# Patient Record
Sex: Male | Born: 1945 | Race: White | Hispanic: No | Marital: Married | Smoking: Never smoker
Health system: Southern US, Community
[De-identification: ages and names within clinical notes are randomized; demographics above are authoritative.]

## PROBLEM LIST (undated history)

## (undated) DIAGNOSIS — E785 Hyperlipidemia, unspecified: Secondary | ICD-10-CM

## (undated) DIAGNOSIS — I10 Essential (primary) hypertension: Secondary | ICD-10-CM

## (undated) DIAGNOSIS — E119 Type 2 diabetes mellitus without complications: Secondary | ICD-10-CM

## (undated) HISTORY — PX: HERNIA REPAIR: SHX51

## (undated) HISTORY — DX: Essential (primary) hypertension: I10

## (undated) HISTORY — PX: CATARACT EXTRACTION: SUR2

## (undated) HISTORY — DX: Hyperlipidemia, unspecified: E78.5

## (undated) HISTORY — DX: Type 2 diabetes mellitus without complications: E11.9

---

## 1999-06-18 ENCOUNTER — Emergency Department (HOSPITAL_COMMUNITY): Admission: EM | Admit: 1999-06-18 | Discharge: 1999-06-18 | Payer: Self-pay | Admitting: Emergency Medicine

## 2014-02-25 DIAGNOSIS — E785 Hyperlipidemia, unspecified: Secondary | ICD-10-CM | POA: Diagnosis not present

## 2014-02-25 DIAGNOSIS — E119 Type 2 diabetes mellitus without complications: Secondary | ICD-10-CM | POA: Diagnosis not present

## 2014-02-25 DIAGNOSIS — I10 Essential (primary) hypertension: Secondary | ICD-10-CM | POA: Diagnosis not present

## 2014-08-23 DIAGNOSIS — Z23 Encounter for immunization: Secondary | ICD-10-CM | POA: Diagnosis not present

## 2014-08-23 DIAGNOSIS — E1165 Type 2 diabetes mellitus with hyperglycemia: Secondary | ICD-10-CM | POA: Diagnosis not present

## 2014-08-23 DIAGNOSIS — E119 Type 2 diabetes mellitus without complications: Secondary | ICD-10-CM | POA: Diagnosis not present

## 2014-08-23 DIAGNOSIS — E785 Hyperlipidemia, unspecified: Secondary | ICD-10-CM | POA: Diagnosis not present

## 2014-08-23 DIAGNOSIS — I10 Essential (primary) hypertension: Secondary | ICD-10-CM | POA: Diagnosis not present

## 2014-11-22 DIAGNOSIS — E119 Type 2 diabetes mellitus without complications: Secondary | ICD-10-CM | POA: Diagnosis not present

## 2014-11-22 DIAGNOSIS — Z794 Long term (current) use of insulin: Secondary | ICD-10-CM | POA: Diagnosis not present

## 2014-11-22 DIAGNOSIS — E785 Hyperlipidemia, unspecified: Secondary | ICD-10-CM | POA: Diagnosis not present

## 2014-11-22 DIAGNOSIS — I10 Essential (primary) hypertension: Secondary | ICD-10-CM | POA: Diagnosis not present

## 2014-11-22 DIAGNOSIS — Z23 Encounter for immunization: Secondary | ICD-10-CM | POA: Diagnosis not present

## 2014-12-27 DIAGNOSIS — Z794 Long term (current) use of insulin: Secondary | ICD-10-CM | POA: Diagnosis not present

## 2014-12-27 DIAGNOSIS — E1165 Type 2 diabetes mellitus with hyperglycemia: Secondary | ICD-10-CM | POA: Diagnosis not present

## 2014-12-27 DIAGNOSIS — H6123 Impacted cerumen, bilateral: Secondary | ICD-10-CM | POA: Diagnosis not present

## 2015-01-14 ENCOUNTER — Ambulatory Visit (INDEPENDENT_AMBULATORY_CARE_PROVIDER_SITE_OTHER): Payer: Medicare Other | Admitting: Physician Assistant

## 2015-01-14 VITALS — BP 118/82 | HR 101 | Temp 97.5°F | Resp 18 | Ht 73.0 in | Wt 238.2 lb

## 2015-01-14 DIAGNOSIS — J069 Acute upper respiratory infection, unspecified: Secondary | ICD-10-CM

## 2015-01-14 DIAGNOSIS — H6982 Other specified disorders of Eustachian tube, left ear: Secondary | ICD-10-CM | POA: Diagnosis not present

## 2015-01-14 MED ORDER — IPRATROPIUM BROMIDE 0.03 % NA SOLN: 2.0000 | Freq: Two times a day (BID) | NASAL | Status: DC

## 2015-01-14 MED ORDER — AMOXICILLIN 875 MG PO TABS: 875.0000 mg | ORAL_TABLET | Freq: Two times a day (BID) | ORAL | Status: DC

## 2015-01-14 MED ORDER — GUAIFENESIN ER 1200 MG PO TB12: 1.0000 | ORAL_TABLET | Freq: Two times a day (BID) | ORAL | Status: DC | PRN

## 2015-01-14 MED ORDER — BENZONATATE 100 MG PO CAPS: 100.0000 mg | ORAL_CAPSULE | Freq: Three times a day (TID) | ORAL | Status: DC | PRN

## 2015-01-14 NOTE — Progress Notes (Signed)
[facility] [address], [place] [address] [phone] [phone]  Date:  01/14/2015   Name:  Lucas Silva   DOB:  07/02/1945   MRN:  [id number]  PCP:  Devra Dopp, MD    History of Present Illness:  Lucas Silva is a 69 y.o. male patient who presents to [facility] for cc of ear pain for 1 month.   Ear pain intermittent for 1 month.  He was seen by his primary care.  They cleaned the ears which helped but pain is returning.  Cough productive white-greenish sputum.  Rhinorrhea.  No sore throat.  Left ear pain.  Hearing okay.  Pain is in the back of left ear.  No sob or dyspnea.     There are no active problems to display for this patient.   History reviewed. No pertinent past medical history.  Past Surgical History  Procedure Laterality Date  . Hernia repair      Social History  Substance Use Topics  . Smoking status: Never Smoker   . Smokeless tobacco: None  . Alcohol Use: No    Family History  Problem Relation Age of Onset  . Diabetes Mother   . Stroke Father     No Known Allergies  Medication list has been reviewed and updated.  No current outpatient prescriptions on file prior to visit.   No current facility-administered medications on file prior to visit.    ROS ROS otherwise unremarkable unless listed above.   Physical Examination: BP 118/82 mmHg  Pulse 101  Temp(Src) 97.5 F (36.4 C) (Oral)  Resp 18  Ht  (1.854 m)  Wt 238 lb 3.2 oz (108.047 kg)  BMI 31.43 kg/m2  SpO2 98% Ideal Body Weight: Weight in (lb) to have BMI = 25: 189.1  Physical Exam  Constitutional: He is oriented to person, place, and time. He appears well-developed and well-nourished. No distress.  HENT:  Head: Atraumatic.  Right Ear: External ear and ear canal normal. No mastoid tenderness. Tympanic membrane is not injected and not perforated.  Left Ear: External ear and ear canal normal. No mastoid tenderness. Tympanic membrane is not injected and  not perforated.  Nose: Rhinorrhea present. No mucosal edema. Right sinus exhibits no maxillary sinus tenderness and no frontal sinus tenderness. Left sinus exhibits no maxillary sinus tenderness and no frontal sinus tenderness.  Mouth/Throat: No uvula swelling. No oropharyngeal exudate, posterior oropharyngeal edema or posterior oropharyngeal erythema.  Minimal bulging with effusion.    Eyes: Conjunctivae, EOM and lids are normal. Pupils are equal, round, and reactive to light. Right eye exhibits normal extraocular motion. Left eye exhibits normal extraocular motion.  Neck: Trachea normal and full passive range of motion without pain. No edema and no erythema present.  Cardiovascular: Normal rate.   Pulmonary/Chest: Effort normal. No respiratory distress. He has no decreased breath sounds. He has no wheezes. He has no rhonchi.  Neurological: He is alert and oriented to person, place, and time.  Skin: Skin is warm and dry. He is not diaphoretic.  Psychiatric: He has a normal mood and affect. His behavior is normal.     Assessment and Plan: Lucas Silva is a 69 y.o. male who is here today for cc of ear pain and cough. Treating with azithromycin.  Will attempt ipratropium nasal spray.  Will hold off on pseudoephedrine or prednisone in this htn and diabetic patient.   Acute upper respiratory infection - Plan: ipratropium (ATROVENT) 0.03 % nasal  spray, amoxicillin (AMOXIL) 875 MG tablet, benzonatate (TESSALON) 100 MG capsule  Eustachian tube dysfunction, left - Plan: Guaifenesin (MUCINEX MAXIMUM STRENGTH) 1200 MG TB12, ipratropium (ATROVENT) 0.03 % nasal spray, amoxicillin (AMOXIL) 875 MG tablet, benzonatate (TESSALON) 100 MG capsule   Trena PlattStephanie Anatalia Kronk, PA-C [facility] 01/14/2015 12:14 PM

## 2015-01-14 NOTE — Patient Instructions (Signed)
Let's take the medication as prescribed. Make sure that you hydrating well with 3 regular sized water bottles.

## 2015-01-15 ENCOUNTER — Encounter: Payer: Self-pay | Admitting: Physician Assistant

## 2015-10-07 ENCOUNTER — Emergency Department (HOSPITAL_COMMUNITY): Payer: Medicare Other

## 2015-10-07 ENCOUNTER — Emergency Department (HOSPITAL_COMMUNITY)
Admission: EM | Admit: 2015-10-07 | Discharge: 2015-10-07 | Disposition: A | Discharge status: Home / Self Care | Payer: Medicare Other | Attending: Emergency Medicine | Admitting: Emergency Medicine

## 2015-10-07 ENCOUNTER — Encounter (HOSPITAL_COMMUNITY): Payer: Self-pay | Admitting: Emergency Medicine

## 2015-10-07 DIAGNOSIS — Z7982 Long term (current) use of aspirin: Secondary | ICD-10-CM | POA: Diagnosis not present

## 2015-10-07 DIAGNOSIS — Z794 Long term (current) use of insulin: Secondary | ICD-10-CM | POA: Diagnosis not present

## 2015-10-07 DIAGNOSIS — M545 Low back pain: Secondary | ICD-10-CM | POA: Insufficient documentation

## 2015-10-07 MED ORDER — METHOCARBAMOL 500 MG PO TABS: 500.0000 mg | ORAL_TABLET | Freq: Two times a day (BID) | ORAL | 0 refills | Status: AC | PRN

## 2015-10-07 MED ORDER — OXYCODONE-ACETAMINOPHEN 5-325 MG PO TABS
1.0000 | ORAL_TABLET | Freq: Once | ORAL | Status: AC
  Administered 2015-10-07: 1 via ORAL
  Filled 2015-10-07: qty 1

## 2015-10-07 MED ORDER — ACETAMINOPHEN 325 MG PO TABS: 650.0000 mg | ORAL_TABLET | Freq: Four times a day (QID) | ORAL | 0 refills | Status: AC | PRN

## 2015-10-07 NOTE — ED Provider Notes (Signed)
[facility]-EMERGENCY DEPT Provider Note   CSN: [id number] Arrival date & time: 10/07/15  1651     History   Chief Complaint Chief Complaint  Patient presents with  . Back Pain    HPI Lucas Silva is a 70 y.o. male.  Lucas Silva is a 70 y.o. Male who presents to the ED complaining of worsening low back pain after bending over earlier today. Patient reports he always has some low back pain but today around 11 AM after he walked to get the mail he had worsening pain after bending over. He reports his pain is worse with movement. He reports his pain is in the midline and does not radiate. He has a history kidney stones reports this feels different. He has taken nothing for treatment of his symptoms today. He denies falls or trauma to his back. He denies history of IV drug use, cancer or falls. He denies any injury or trauma to his back. He denies fevers, loss of bladder control, loss of bowel control, difficulty urinating, urinary symptoms, hematuria, abdominal pain, nausea, vomiting, numbness, or weakness.   The history is provided by the patient and the spouse. No language interpreter was used.  Back Pain   Pertinent negatives include no chest pain, no fever, no numbness, no headaches, no abdominal pain, no dysuria and no weakness.    History reviewed. No pertinent past medical history.  There are no active problems to display for this patient.   Past Surgical History:  Procedure Laterality Date  . HERNIA REPAIR         Home Medications    Prior to Admission medications   Medication Sig Start Date End Date Taking? Authorizing Provider  aspirin 81 MG tablet Take 81 mg by mouth daily.   Yes Historical Provider, MD  cholecalciferol (VITAMIN D) 1000 units tablet Take 1,000 Units by mouth daily.   Yes Historical Provider, MD  hydrochlorothiazide (HYDRODIURIL) 25 MG tablet Take 25 mg by mouth daily.   Yes Historical Provider, MD  insulin aspart protamine- aspart (NOVOLOG  MIX 70/30) (70-30) 100 UNIT/ML injection Inject 40 Units into the skin daily.    Yes Historical Provider, MD  lisinopril (PRINIVIL,ZESTRIL) 40 MG tablet Take 40 mg by mouth daily.   Yes Historical Provider, MD  metFORMIN (GLUMETZA) 1000 MG (MOD) 24 hr tablet Take 1,000 mg by mouth 2 (two) times daily.    Yes Historical Provider, MD  simvastatin (ZOCOR) 10 MG tablet Take 10 mg by mouth daily.   Yes Historical Provider, MD  acetaminophen (TYLENOL) 325 MG tablet Take 2 tablets (650 mg total) by mouth every 6 (six) hours as needed for mild pain or moderate pain. 10/07/15   Everlene FarrierWilliam Saudia Smyser, PA-C  methocarbamol (ROBAXIN) 500 MG tablet Take 1 tablet (500 mg total) by mouth 2 (two) times daily as needed for muscle spasms. 10/07/15   Everlene FarrierWilliam Brinley Treanor, PA-C    Family History Family History  Problem Relation Age of Onset  . Diabetes Mother   . Stroke Father     Social History Social History  Substance Use Topics  . Smoking status: Never Smoker  . Smokeless tobacco: Not on file  . Alcohol use No     Allergies   Review of patient's allergies indicates no known allergies.   Review of Systems Review of Systems  Constitutional: Negative for chills and fever.  HENT: Negative for congestion and sore throat.   Eyes: Negative for visual disturbance.  Respiratory: Negative for cough and shortness  of breath.   Cardiovascular: Negative for chest pain.  Gastrointestinal: Negative for abdominal pain, nausea and vomiting.  Genitourinary: Negative for decreased urine volume, difficulty urinating, dysuria, frequency, hematuria and urgency.  Musculoskeletal: Positive for back pain. Negative for gait problem, joint swelling and neck pain.  Skin: Negative for rash.  Neurological: Negative for weakness, numbness and headaches.     Physical Exam Updated Vital Signs BP 126/72 (BP Location: Left Arm)   Pulse 86   Temp 98.4 F (36.9 C) (Oral)   Resp 14   SpO2 98%   Physical Exam  Constitutional: He is  oriented to person, place, and time. He appears well-developed and well-nourished. No distress.  Nontoxic appearing.  HENT:  Head: Normocephalic and atraumatic.  Eyes: Conjunctivae are normal. Pupils are equal, round, and reactive to light. Right eye exhibits no discharge. Left eye exhibits no discharge.  Neck: Neck supple.  Cardiovascular: Normal rate, regular rhythm, normal heart sounds and intact distal pulses.   Pulmonary/Chest: Effort normal and breath sounds normal. No respiratory distress. He has no wheezes. He has no rales.  Abdominal: Soft. There is no tenderness.  Abdomen is soft and nontender to palpation. No CVA or flank tenderness.  Musculoskeletal: Normal range of motion. He exhibits tenderness. He exhibits no edema or deformity.  Patient has tenderness to his midline lumbar spine. No crepitus or deformity. No CVA or flank tenderness. No back erythema, deformity, ecchymosis or warmth. Good strength his bilateral lower extremities.  Lymphadenopathy:    He has no cervical adenopathy.  Neurological: He is alert and oriented to person, place, and time. He has normal reflexes. He displays normal reflexes. Coordination normal.  The patient is alert and oriented 3. Sensation is intact his bilateral lower extremities. Normal gait. Bilateral patellar DTRs are intact.  Skin: Skin is warm and dry. Capillary refill takes less than 2 seconds. No rash noted. He is not diaphoretic. No erythema. No pallor.  Psychiatric: He has a normal mood and affect. His behavior is normal.  Nursing note and vitals reviewed.    ED Treatments / Results  Labs (all labs ordered are listed, but only abnormal results are displayed) Labs Reviewed - No data to display  EKG  EKG Interpretation None       Radiology Dg Lumbar Spine Complete  Result Date: 10/07/2015 CLINICAL DATA:  Acute onset of left lower back pain. Initial encounter. EXAM: LUMBAR SPINE - COMPLETE 4+ VIEW COMPARISON:  None. FINDINGS:  There is no evidence of fracture or subluxation. Vertebral bodies demonstrate normal height and alignment. Intervertebral disc spaces are preserved. Mild facet disease is noted at the lower lumbar spine. The visualized bowel gas pattern is unremarkable in appearance; air and stool are noted within the colon. The sacroiliac joints are within normal limits. IMPRESSION: No evidence of fracture or subluxation along the lumbar spine. Electronically Signed   By: Roanna Raider M.D.   On: 10/07/2015 19:36    Procedures Procedures (including critical care time)  Medications Ordered in ED Medications  oxyCODONE-acetaminophen (PERCOCET/ROXICET) 5-325 MG per tablet 1 tablet (1 tablet Oral Given 10/07/15 1855)     Initial Impression / Assessment and Plan / ED Course  I have reviewed the triage vital signs and the nursing notes.  Pertinent labs & imaging results that were available during my care of the patient were reviewed by me and considered in my medical decision making (see chart for details).  Clinical Course   This is a 70 y.o. Male who presents  to the ED complaining of worsening low back pain after bending over earlier today. Patient reports he always has some low back pain but today around 11 AM after he walked to get the mail he had worsening pain after bending over. He reports his pain is worse with movement. He reports his pain is in the midline and does not radiate. He has a history kidney stones reports this feels different. He has taken nothing for treatment of his symptoms today. He denies falls or trauma to his back. Patient with back pain.  No neurological deficits and normal neuro exam.  Patient can walk with normal gait.  No loss of bowel or bladder control.  No concern for cauda equina.  No fever, night sweats, weight loss, h/o cancer, IVDU. X-ray of his lumbar spine was obtained which was unremarkable. He reports feeling better after pain medication in the ER. Patient cannot take NSAIDs.  He elected for tylenol and robaxin for use at home. I discussed precautions when using robaxin. I advised the patient to follow-up with their primary care provider this week. I advised the patient to return to the emergency department with new or worsening symptoms or new concerns. The patient verbalized understanding and agreement with plan.    Final Clinical Impressions(s) / ED Diagnoses   Final diagnoses:  Midline low back pain, with sciatica presence unspecified    New Prescriptions Discharge Medication List as of 10/07/2015  8:54 PM    START taking these medications   Details  acetaminophen (TYLENOL) 325 MG tablet Take 2 tablets (650 mg total) by mouth every 6 (six) hours as needed for mild pain or moderate pain., Starting Sat 10/07/2015, Print    methocarbamol (ROBAXIN) 500 MG tablet Take 1 tablet (500 mg total) by mouth 2 (two) times daily as needed for muscle spasms., Starting Sat 10/07/2015, Print         Everlene Farrier, PA-C 10/08/15 [id number]    Benjiman Core, MD 10/08/15 [phone]

## 2015-10-07 NOTE — ED Triage Notes (Signed)
Pt c/o middle back pain since earlier today. Pt sts he was having pain on his L side but then bent down to get mail and has had severe pain ever since. Pt denies numbness and tingling. Pt has hx of kidney stones but denies urinary symptoms at this time. Pt A&Ox4 and ambulatory.

## 2015-10-07 NOTE — ED Notes (Signed)
PA at bedside.

## 2015-10-07 NOTE — ED Notes (Signed)
Patient transported to X-ray 

## 2017-05-24 ENCOUNTER — Encounter: Payer: Self-pay | Admitting: Urgent Care

## 2017-05-24 ENCOUNTER — Ambulatory Visit (INDEPENDENT_AMBULATORY_CARE_PROVIDER_SITE_OTHER): Payer: Medicare Other | Admitting: Urgent Care

## 2017-05-24 VITALS — BP 152/76 | HR 97 | Temp 98.7°F | Ht 71.0 in | Wt 262.0 lb

## 2017-05-24 DIAGNOSIS — K1379 Other lesions of oral mucosa: Secondary | ICD-10-CM

## 2017-05-24 DIAGNOSIS — K029 Dental caries, unspecified: Secondary | ICD-10-CM

## 2017-05-24 DIAGNOSIS — R030 Elevated blood-pressure reading, without diagnosis of hypertension: Secondary | ICD-10-CM

## 2017-05-24 DIAGNOSIS — K047 Periapical abscess without sinus: Secondary | ICD-10-CM | POA: Diagnosis not present

## 2017-05-24 DIAGNOSIS — I10 Essential (primary) hypertension: Secondary | ICD-10-CM

## 2017-05-24 MED ORDER — AMOXICILLIN-POT CLAVULANATE 875-125 MG PO TABS: 1.0000 | ORAL_TABLET | Freq: Two times a day (BID) | ORAL | 0 refills | Status: DC

## 2017-05-24 NOTE — Addendum Note (Signed)
Addended by: Wallis Bamberg on: 05/24/2017 10:14 AM   Modules accepted: Level of Service

## 2017-05-24 NOTE — Patient Instructions (Addendum)
Eat soups, soft foods. Hydrate well with at least 2 liters (1 gallon) of water daily. You may take  Tylenol with ibuprofen 400-600mg  every 6 hours for pain and inflammation. Please make sure you set up an evaluation with a dentist as soon as possible.    Dental Pain Dental pain may be caused by many things, including:  Tooth decay (cavities or caries). Cavities expose the nerve of your tooth to air and hot or cold temperatures. This can cause pain or discomfort.  Abscess or infection. A dental abscess is a collection of infected pus from a bacterial infection in the inner part of the tooth (pulp). It usually occurs at the end of the tooth's root.  Injury.  An unknown reason (idiopathic).  Your pain may be mild or severe. It may only occur when:  You are chewing.  You are exposed to hot or cold temperature.  You are eating or drinking sugary foods or beverages, such as soda or candy.  Your pain may also be constant. Follow these instructions at home: Watch your dental pain for any changes. The following actions may help to lessen any discomfort that you are feeling:  Take medicines only as directed by your dentist.  If you were prescribed an antibiotic medicine, finish all of it even if you start to feel better.  Keep all follow-up visits as directed by your dentist. This is important.  Do not apply heat to the outside of your face.  Rinse your mouth or gargle with salt water if directed by your dentist. This helps with pain and swelling. ? You can make salt water by adding  tsp of salt to 1 cup of warm water.  Apply ice to the painful area of your face: ? Put ice in a plastic bag. ? Place a towel between your skin and the bag. ? Leave the ice on for 20 minutes, 2-3 times per day.  Avoid foods or drinks that cause you pain, such as: ? Very hot or very cold foods or drinks. ? Sweet or sugary foods or drinks.  Contact a health care provider if:  Your pain is not  controlled with medicines.  Your symptoms are worse.  You have new symptoms. Get help right away if:  You are unable to open your mouth.  You are having trouble breathing or swallowing.  You have a fever.  Your face, neck, or jaw is swollen. This information is not intended to replace advice given to you by your health care provider. Make sure you discuss any questions you have with your health care provider. Document Released: 12/31/2004 Document Revised: 05/11/2015 Document Reviewed: 12/27/2013 Elsevier Interactive Patient Education  2018 [employer].     IF you received an x-ray today, you will receive an invoice from [facility]. Please contact [facility] at [phone] with questions or concerns regarding your invoice.   IF you received labwork today, you will receive an invoice from [facility]. Please contact [facility] at [phone] with questions or concerns regarding your invoice.   Our billing staff will not be able to assist you with questions regarding bills from these companies.  You will be contacted with the lab results as soon as they are available. The fastest way to get your results is to activate your My Chart account. Instructions are located on the last page of this paperwork. If you have not heard from [place] regarding the results in 2 weeks, please contact this office.

## 2017-05-24 NOTE — Progress Notes (Signed)
    MRN: [id number] DOB: 03-Jan-1946  Subjective:   Lucas Silva is a 72 y.o. male presenting for 3-day history of upper tooth pain, warmth of his skin and subjective fever.  Patient denies sinus congestion, sinus pain, throat pain.  He states that his teeth are really bad but he does not get dental care.  He is able to swallow and eat foods, drink liquids.  He does have pain with from this however.  Regarding his blood pressure, patient reports that he is compliant with his medications.  Reports that it usually within normal range in the 120s.  Denies chest pain, heart racing, palpitations, nausea, vomiting, belly pain.  Does have a visit scheduled soon with his PCP.  His kidney function from December 2018 was completely normal.  Jaishaun has a current medication list which includes the following prescription(s): acetaminophen, aspirin, cholecalciferol, hydrochlorothiazide, insulin aspart protamine- aspart, lisinopril, metformin, methocarbamol, and simvastatin. Also has No Known Allergies.  Revis  has a past medical history of Diabetes mellitus without complication (HCC), Hyperlipidemia, and Hypertension. Also  has a past surgical history that includes Hernia repair.  Objective:   Vitals: BP (!) 152/76 (BP Location: Left Arm, Patient Position: Sitting, Cuff Size: Normal)   Pulse 97   Temp 98.7 F (37.1 C) (Oral)   Ht  (1.803 m)   Wt 262 lb (118.8 kg)   SpO2 92%   BMI 36.54 kg/m   BP Readings from Last 3 Encounters:  05/24/17 (!) 152/76  10/07/15 126/72  01/14/15 118/82    Physical Exam  Constitutional: He is oriented to person, place, and time. He appears well-developed and well-nourished.  HENT:  Head:    Mouth/Throat:    Cardiovascular: Normal rate, regular rhythm and intact distal pulses. Exam reveals no gallop and no friction rub.  No murmur heard. Pulmonary/Chest: Effort normal. No respiratory distress. He has no wheezes. He has no rales.  Neurological: He is  alert and oriented to person, place, and time.   Assessment and Plan :   Dental infection  Tooth decay  Dental caries  Oral pain  Essential hypertension  Elevated blood pressure reading  We will have patient start Augmentin to address dental infection.  I emphasized need for consult with a dentist.  He plans on getting in touch with one on Monday.  In the meantime I recommended he eat soft foods, soups and drink liquids.  He is to use Tylenol and ibuprofen for pain and inflammation.  He will maintain his blood pressure medications and follow-up with his PCP if his blood pressure remains elevated.  Wallis Bamberg, PA-C [facility] [phone] 05/24/2017  9:46 AM

## 2018-04-26 IMAGING — CR DG LUMBAR SPINE COMPLETE 4+V
5 series · 5 of 5 positions shown · non-contrast
Comparison: None.

CLINICAL DATA: Acute onset of left lower back pain. Initial
encounter.

EXAM:
LUMBAR SPINE - COMPLETE 4+ VIEW

[t lumbar spine ap]
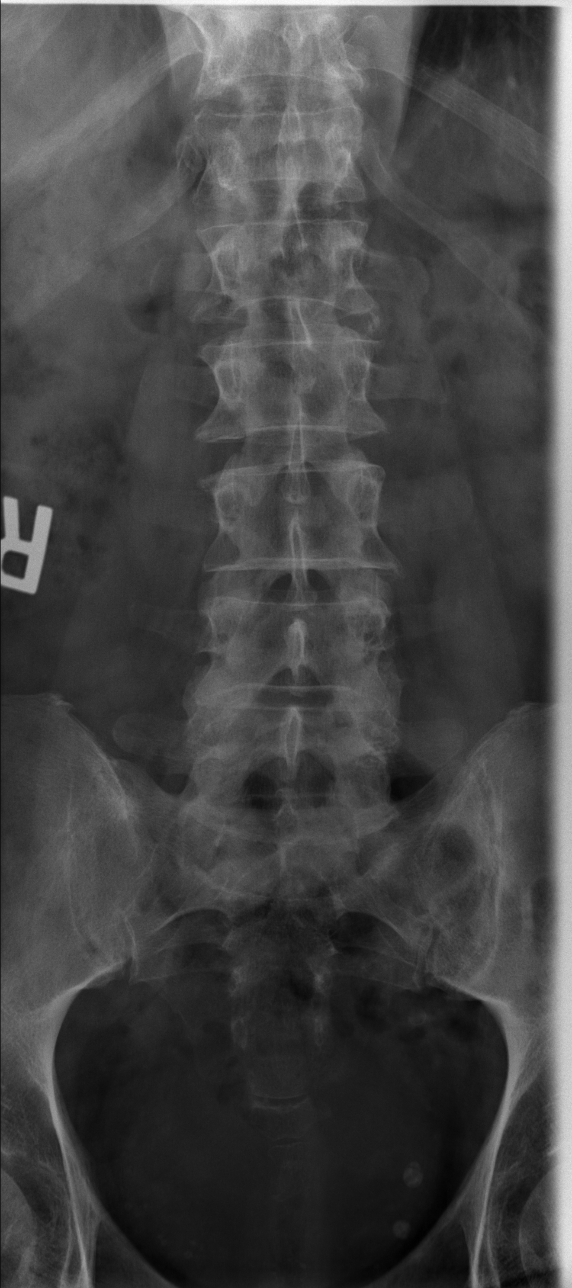

[t lumbar spine obl (1 of 2)]
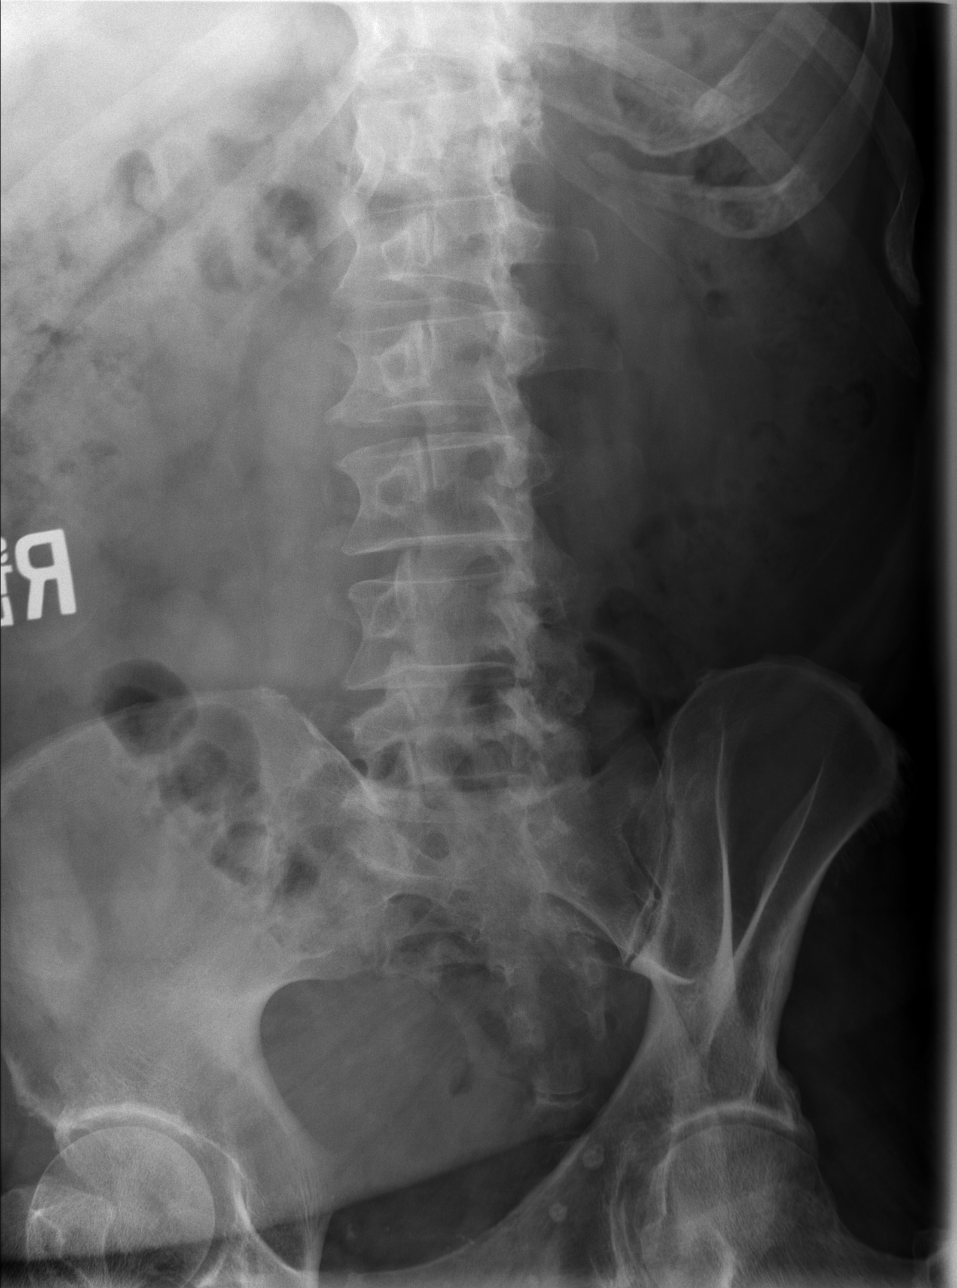

[t lumbar spine obl (2 of 2)]
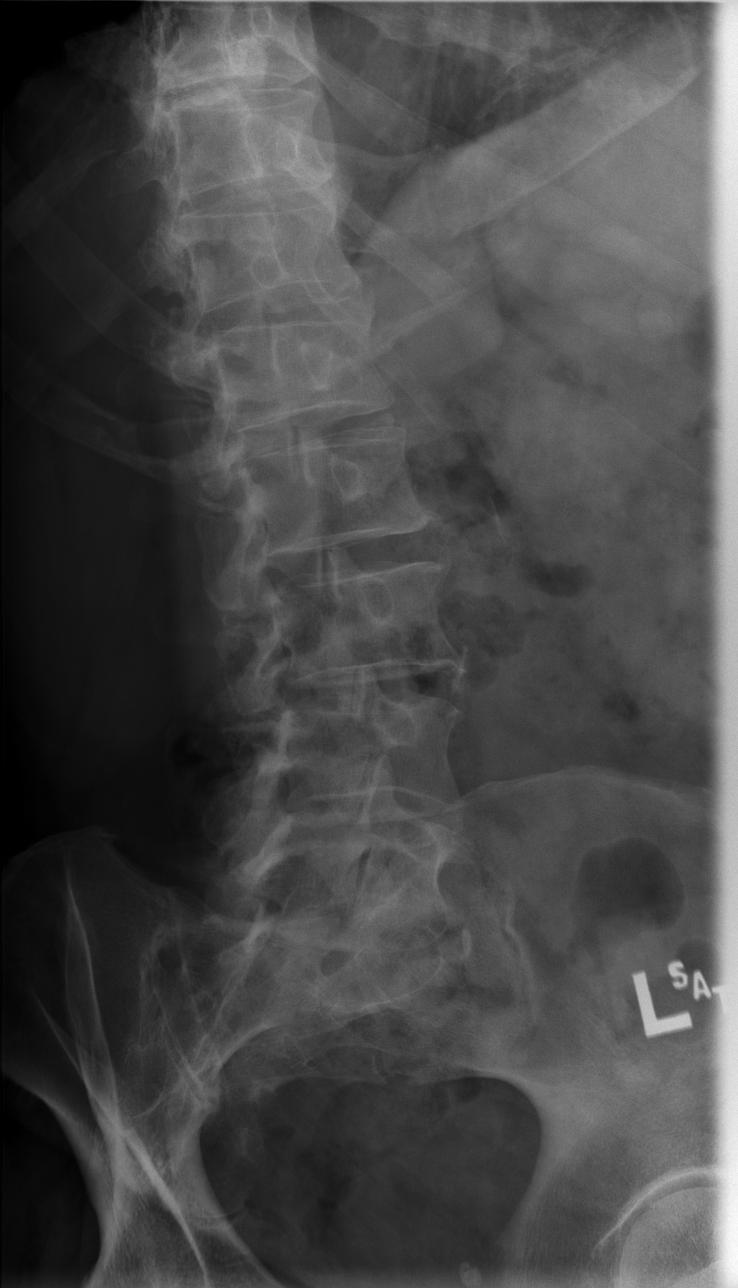

[t lumbar spine lat]
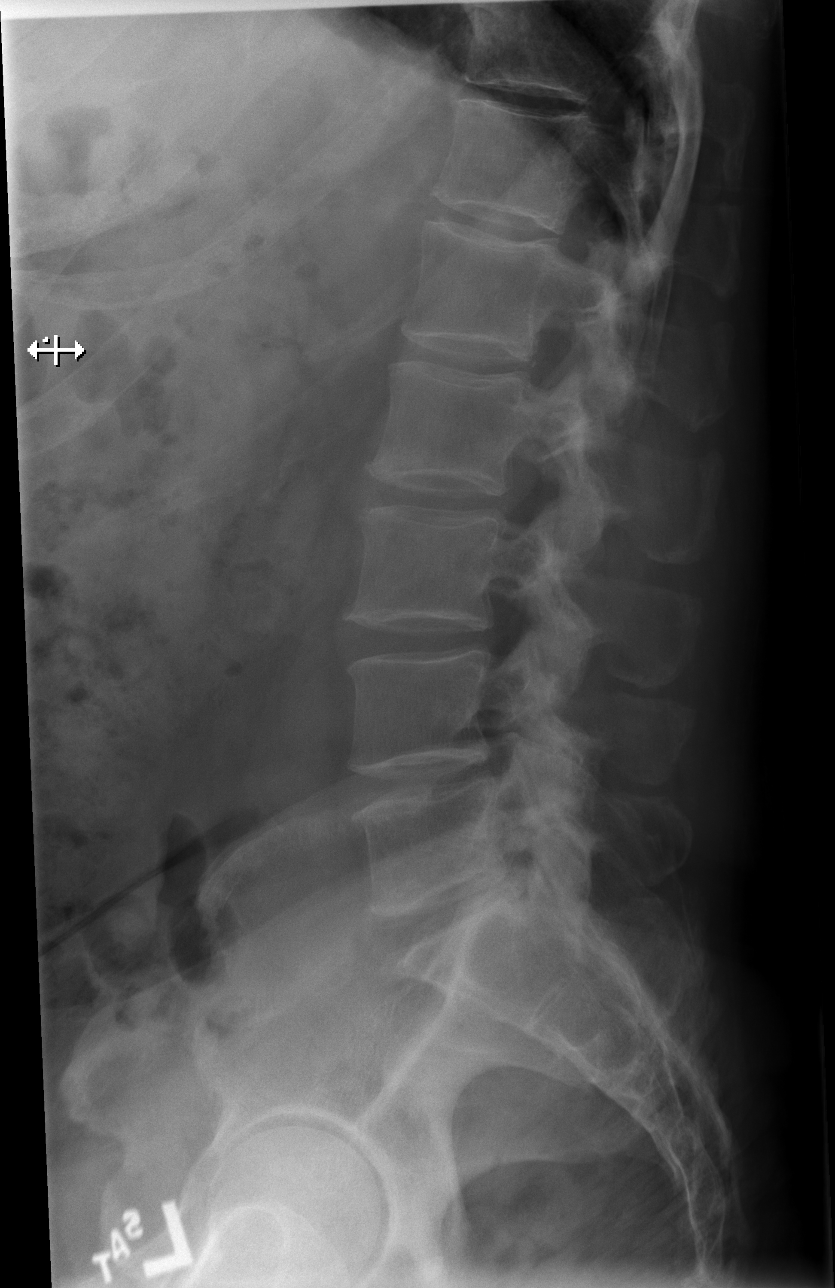

[t lumbar l-5 s-1 spot]
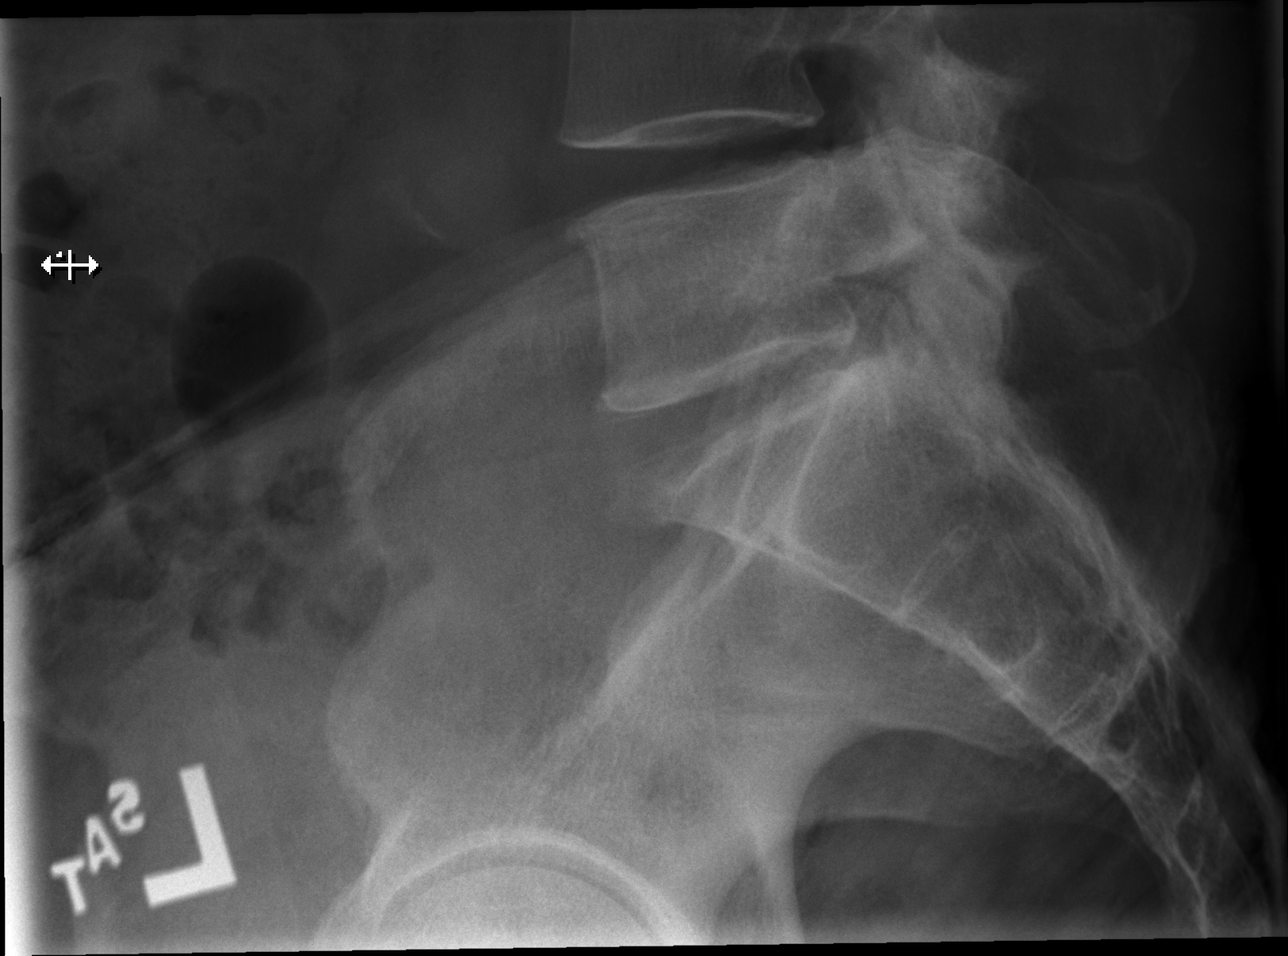

[5 of 5 positions shown; findings below may reference images not displayed]

FINDINGS: There is no evidence of fracture or subluxation. Vertebral bodies
demonstrate normal height and alignment. Intervertebral disc spaces
are preserved. Mild facet disease is noted at the lower lumbar
spine.

The visualized bowel gas pattern is unremarkable in appearance; air
and stool are noted within the colon. The sacroiliac joints are
within normal limits.
IMPRESSION: No evidence of fracture or subluxation along the lumbar spine.

## 2020-03-10 ENCOUNTER — Ambulatory Visit (INDEPENDENT_AMBULATORY_CARE_PROVIDER_SITE_OTHER): Payer: Medicare Other | Admitting: Nurse Practitioner

## 2020-03-10 ENCOUNTER — Other Ambulatory Visit: Payer: Self-pay

## 2020-03-10 VITALS — BP 114/77 | HR 77 | Temp 98.5°F | Ht 71.0 in | Wt 228.3 lb

## 2020-03-10 DIAGNOSIS — E1122 Type 2 diabetes mellitus with diabetic chronic kidney disease: Secondary | ICD-10-CM

## 2020-03-10 DIAGNOSIS — E119 Type 2 diabetes mellitus without complications: Secondary | ICD-10-CM

## 2020-03-10 DIAGNOSIS — Z794 Long term (current) use of insulin: Secondary | ICD-10-CM

## 2020-03-10 DIAGNOSIS — B029 Zoster without complications: Secondary | ICD-10-CM

## 2020-03-10 DIAGNOSIS — Z7689 Persons encountering health services in other specified circumstances: Secondary | ICD-10-CM | POA: Diagnosis not present

## 2020-03-10 DIAGNOSIS — I129 Hypertensive chronic kidney disease with stage 1 through stage 4 chronic kidney disease, or unspecified chronic kidney disease: Secondary | ICD-10-CM

## 2020-03-10 DIAGNOSIS — E785 Hyperlipidemia, unspecified: Secondary | ICD-10-CM

## 2020-03-10 DIAGNOSIS — E1169 Type 2 diabetes mellitus with other specified complication: Secondary | ICD-10-CM | POA: Diagnosis not present

## 2020-03-10 MED ORDER — TRIAMCINOLONE ACETONIDE 0.1 % EX CREA: 1.0000 "application " | TOPICAL_CREAM | Freq: Two times a day (BID) | CUTANEOUS | 0 refills | Status: DC

## 2020-03-10 MED ORDER — VALACYCLOVIR HCL 1 G PO TABS: 1000.0000 mg | ORAL_TABLET | Freq: Two times a day (BID) | ORAL | 0 refills | Status: DC

## 2020-03-10 NOTE — Patient Instructions (Signed)
Shingles  Shingles is an infection. It gives you a painful skin rash and blisters that have fluid in them. Shingles is caused by the same germ (virus) that causes chickenpox. Shingles only happens in people who:  Have had chickenpox.  Have been given a shot of medicine (vaccine) to protect against chickenpox. Shingles is rare in this group. The first symptoms of shingles may be itching, tingling, or pain in an area on your skin. A rash will show on your skin a few days or weeks later. The rash is likely to be on one side of your body. The rash usually has a shape like a belt or a band. Over time, the rash turns into fluid-filled blisters. The blisters will break open, change into scabs, and dry up. Medicines may:  Help with pain and itching.  Help you get better sooner.  Help to prevent long-term problems. Follow these instructions at home: Medicines  Take over-the-counter and prescription medicines only as told by your doctor.  Put on an anti-itch cream or numbing cream where you have a rash, blisters, or scabs. Do this as told by your doctor. Helping with itching and discomfort  Put cold, wet cloths (cold compresses) on the area of the rash or blisters as told by your doctor.  Cool baths can help you feel better. Try adding baking soda or dry oatmeal to the water to lessen itching. Do not bathe in hot water.   Blister and rash care  Keep your rash covered with a loose bandage (dressing).  Wear loose clothing that does not rub on your rash.  Keep your rash and blisters clean. To do this, wash the area with mild soap and cool water as told by your doctor.  Check your rash every day for signs of infection. Check for: ? More redness, swelling, or pain. ? Fluid or blood. ? Warmth. ? Pus or a bad smell.  Do not scratch your rash. Do not pick at your blisters. To help you to not scratch: ? Keep your fingernails clean and cut short. ? Wear gloves or mittens when you sleep, if  scratching is a problem. General instructions  Rest as told by your doctor.  Keep all follow-up visits as told by your doctor. This is important.  Wash your hands often with soap and water. If soap and water are not available, use hand sanitizer. Doing this lowers your chance of getting a skin infection caused by germs (bacteria).  Your infection can cause chickenpox in people who have never had chickenpox or never got a shot of chickenpox vaccine. If you have blisters that did not change into scabs yet, try not to touch other people or be around other people, especially: ? Babies. ? Pregnant women. ? Children who have areas of red, itchy, or rough skin (eczema). ? Very old people who have transplants. ? People who have a long-term (chronic) sickness, like cancer or AIDS. Contact a doctor if:  Your pain does not get better with medicine.  Your pain does not get better after the rash heals.  You have any signs of infection in the rash area. These signs include: ? More redness, swelling, or pain around the rash. ? Fluid or blood coming from the rash. ? The rash area feeling warm to the touch. ? Pus or a bad smell coming from the rash. Get help right away if:  The rash is on your face or nose.  You have pain in your face or pain   by your eye.  You lose feeling on one side of your face.  You have trouble seeing.  You have ear pain, or you have ringing in your ear.  You have a loss of taste.  Your condition gets worse. Summary  Shingles gives you a painful skin rash and blisters that have fluid in them.  Shingles is an infection. It is caused by the same germ (virus) that causes chickenpox.  Keep your rash covered with a loose bandage (dressing). Wear loose clothing that does not rub on your rash.  If you have blisters that did not change into scabs yet, try not to touch other people or be around people. This information is not intended to replace advice given to you by  your health care provider. Make sure you discuss any questions you have with your health care provider. Document Revised: 04/24/2018 Document Reviewed: 09/04/2016 Elsevier Patient Education  2021 Elsevier Inc.  

## 2020-03-10 NOTE — Progress Notes (Signed)
New Patient Office Visit  Subjective:  Patient ID: Lucas Silva, male    DOB: 1945-10-19  Age: 75 y.o. MRN: [id number]  CC:  Chief Complaint  Patient presents with  . New Patient (Initial Visit)    HPI Lucas Silva presents for establishment of primary care. He has medical history of diabetes, hypertension, and hyperlipidemia. He was seeing Azzie Roup, NP in [place]. Provider recently moved away. The patient last saw his PCP in 09/2019 for medicare wellness visit. The patient is followed in the [place] [facility]. He sees an endocrinologist, general internal medicine provider, an ophthalmologist, and ear doctor. He states that he last had blood work done in [place] in September and is due to go back in March. Will have more blood work done at that time. He states that his last colonoscopy was done approximately 8 years ago and was told he only needed to repeat the procedure in ten years. Medical records, test results, and progress notes will be requested from [place] today to update that patient's chart.  Today, the patient is complaining of "spider bites" on the left part of his back. He states that he first noted them on Wednesday. He state that they are very itchy. He has been using OTC hydrocortisone cream, but this has not helped. The bites are only on the left side of his back. They are nowhere else on his body. He denies fever, chills, headache, fatigue, or appetite changes. He denies nausea, vomiting, or diarrhea.   Past Medical History:  Diagnosis Date  . Diabetes mellitus without complication (HCC)   . Hyperlipidemia   . Hypertension     Past Surgical History:  Procedure Laterality Date  . HERNIA REPAIR      Family History  Problem Relation Age of Onset  . Diabetes Mother   . Stroke Father     Social History   Socioeconomic History  . Marital status: Married    Spouse name: Not on file  . Number of children: Not on file  . Years of education: Not on file   . Highest education level: Not on file  Occupational History  . Not on file  Tobacco Use  . Smoking status: Never Smoker  . Smokeless tobacco: Never Used  Substance and Sexual Activity  . Alcohol use: No    Alcohol/week: 0.0 standard drinks  . Drug use: No  . Sexual activity: Not on file  Other Topics Concern  . Not on file  Social History Narrative  . Not on file   Social Determinants of Health   Financial Resource Strain: Not on file  Food Insecurity: Not on file  Transportation Needs: Not on file  Physical Activity: Not on file  Stress: Not on file  Social Connections: Not on file  Intimate Partner Violence: Not on file    ROS Review of Systems  Skin: Positive for rash.       Several lesions along the left side of the spine which are red and described as itchy.   All other systems reviewed and are negative.   Objective:   Today's Vitals: BP 114/77   Pulse 77   Temp 98.5 F (36.9 C)   Ht 5\' 11"  (1.803 m)   Wt 228 lb 4.8 oz (103.6 kg)   SpO2 98%   BMI 31.84 kg/m   Physical Exam Vitals and nursing note reviewed.  Constitutional:      Appearance: Normal appearance. He is well-developed and well-nourished.  HENT:  Head: Normocephalic.     Nose: Nose normal.  Eyes:     Pupils: Pupils are equal, round, and reactive to light.  Neck:     Vascular: No carotid bruit.  Cardiovascular:     Rate and Rhythm: Normal rate and regular rhythm.     Pulses: Normal pulses.     Heart sounds: Normal heart sounds.  Pulmonary:     Effort: Pulmonary effort is normal.     Breath sounds: Normal breath sounds.  Abdominal:     General: Bowel sounds are normal.     Palpations: Abdomen is soft.     Tenderness: There is no abdominal tenderness.  Musculoskeletal:        General: Normal range of motion.     Cervical back: Normal range of motion and neck supple.  Skin:    General: Skin is warm and dry.       Neurological:     Mental Status: He is alert and oriented to  person, place, and time.  Psychiatric:        Mood and Affect: Mood and affect normal.     Assessment & Plan:  1. Encounter to establish care Patient in office to establish with new primary care provider. Will get medical records, labs, and progress notes from [place] hospital to review and update his chart.   2. Herpes zoster without complication Start valacyclovir 1000mg  twice dialy for 5 days. May apply triamcinolone 0.1% cream twice daily as needed for itching. Patient states that he has had Shingles vaccine. Will monitor.  - valACYclovir (VALTREX) 1000 MG tablet; Take 1 tablet (1,000 mg total) by mouth 2 (two) times daily.  Dispense: 10 tablet; Refill: 0 - triamcinolone (KENALOG) 0.1 %; Apply 1 application topically 2 (two) times daily.  Dispense: 30 g; Refill: 0  3. Hyperlipidemia due to type 2 diabetes mellitus (HCC) Continue simvastatin as prescribed.  - simvastatin (ZOCOR) 40 MG tablet; Take 1 tablet (40 mg total) by mouth daily.  Dispense: 90 tablet; Refill: 1  4. 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  Dispense: 90 tablet; Refill: 1 - lisinopril (ZESTRIL) 40 MG tablet; Take 1 tablet (40 mg total) by mouth daily.  Dispense: 90 tablet; Refill: 1  5. Type 2 diabetes mellitus without complication, with long-term current use of insulin (HCC) Renew metformin 1000mg  twice daily. Diabetes monitored per [facility] hospital. Will obtain records to review.  - metFORMIN (GLUMETZA) 1000 MG (MOD) 24 hr tablet; Take 1 tablet (1,000 mg total) by mouth 2 (two) times daily.  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)   Relevant Medications   Semaglutide,0.25 or 0.5MG /DOS, (OZEMPIC,  0.25 OR 0.5 MG/DOSE,) 2 MG/1.5ML SOPN   hydrochlorothiazide (HYDRODIURIL) 25 MG tablet   lisinopril (ZESTRIL) 40 MG tablet   metFORMIN (GLUMETZA) 1000 MG (MOD) 24 hr tablet   simvastatin (ZOCOR) 40 MG tablet     Endocrine   Hyperlipidemia due to type 2 diabetes mellitus (HCC)   Relevant Medications   Semaglutide,0.25 or 0.5MG /DOS, (OZEMPIC, 0.25 OR 0.5 MG/DOSE,) 2 MG/1.5ML SOPN   hydrochlorothiazide (HYDRODIURIL) 25 MG tablet   lisinopril (ZESTRIL) 40 MG tablet   metFORMIN (GLUMETZA) 1000 MG (MOD)  24 hr tablet   simvastatin (ZOCOR) 40 MG tablet   Type 2 diabetes mellitus without complication, with long-term current use of insulin (HCC)   Relevant Medications   Semaglutide,0.25 or 0.5MG /DOS, (OZEMPIC, 0.25 OR 0.5 MG/DOSE,) 2 MG/1.5ML SOPN   lisinopril (ZESTRIL) 40 MG tablet   metFORMIN (GLUMETZA) 1000 MG (MOD) 24 hr tablet   simvastatin (ZOCOR) 40 MG tablet     Other   Encounter to establish care - Primary   Herpes zoster without complication   Relevant Medications   valACYclovir (VALTREX) 1000 MG tablet   triamcinolone (KENALOG) 0.1 %      Outpatient Encounter Medications as of 03/10/2020  Medication Sig  . Semaglutide,0.25 or 0.5MG /DOS, (OZEMPIC, 0.25 OR 0.5 MG/DOSE,) 2 MG/1.5ML SOPN Inject into the skin.  Marland Kitchen triamcinolone (KENALOG) 0.1 % Apply 1 application topically 2 (two) times daily.  . valACYclovir (VALTREX) 1000 MG tablet Take 1 tablet (1,000 mg total) by mouth 2 (two) times daily.  Marland Kitchen acetaminophen (TYLENOL) 325 MG tablet Take 2 tablets (650 mg total) by mouth every 6 (six) hours as needed for mild pain or moderate pain.  Marland Kitchen amoxicillin-clavulanate (AUGMENTIN) 875-125 MG tablet Take 1 tablet by mouth 2 (two) times daily.  Marland Kitchen aspirin 81 MG tablet Take 81 mg by mouth daily.  . cholecalciferol (VITAMIN D) 1000 units tablet Take 1,000 Units by mouth daily.  . hydrochlorothiazide (HYDRODIURIL) 25 MG tablet Take 1 tablet (25 mg total) by mouth daily.  . insulin aspart protamine-  aspart (NOVOLOG MIX 70/30) (70-30) 100 UNIT/ML injection Inject 40 Units into the skin daily.   Marland Kitchen lisinopril (ZESTRIL) 40 MG tablet Take 1 tablet (40 mg total) by mouth daily.  . metFORMIN (GLUMETZA) 1000 MG (MOD) 24 hr tablet Take 1 tablet (1,000 mg total) by mouth 2 (two) times daily.  . methocarbamol (ROBAXIN) 500 MG tablet Take 1 tablet (500 mg total) by mouth 2 (two) times daily as needed for muscle spasms.  . simvastatin (ZOCOR) 40 MG tablet Take 1 tablet (40 mg total) by mouth daily.  . [DISCONTINUED] hydrochlorothiazide (HYDRODIURIL) 25 MG tablet Take 25 mg by mouth daily.  . [DISCONTINUED] lisinopril (PRINIVIL,ZESTRIL) 40 MG tablet Take 40 mg by mouth daily.  . [DISCONTINUED] metFORMIN (GLUMETZA) 1000 MG (MOD) 24 hr tablet Take 1,000 mg by mouth 2 (two) times daily.   . [DISCONTINUED] simvastatin (ZOCOR) 10 MG tablet Take 10 mg by mouth daily.   No facility-administered encounter medications on file as of 03/10/2020.   Time spent with the patient was approximately 30 minutes. This time included reviewing progress notes, labs, imaging studies, and discussing plan for follow up.    Follow-up: Return in about 6 months (around 09/07/2020) for [facility] with [facility] few days before.   Carlean Jews, NP

## 2020-03-13 ENCOUNTER — Telehealth: Payer: Self-pay | Admitting: Nurse Practitioner

## 2020-03-13 ENCOUNTER — Encounter: Payer: Self-pay | Admitting: Nurse Practitioner

## 2020-03-13 DIAGNOSIS — E1122 Type 2 diabetes mellitus with diabetic chronic kidney disease: Secondary | ICD-10-CM | POA: Insufficient documentation

## 2020-03-13 DIAGNOSIS — E119 Type 2 diabetes mellitus without complications: Secondary | ICD-10-CM | POA: Insufficient documentation

## 2020-03-13 DIAGNOSIS — Z794 Long term (current) use of insulin: Secondary | ICD-10-CM | POA: Insufficient documentation

## 2020-03-13 DIAGNOSIS — B029 Zoster without complications: Secondary | ICD-10-CM | POA: Insufficient documentation

## 2020-03-13 DIAGNOSIS — Z7689 Persons encountering health services in other specified circumstances: Secondary | ICD-10-CM | POA: Insufficient documentation

## 2020-03-13 DIAGNOSIS — E785 Hyperlipidemia, unspecified: Secondary | ICD-10-CM | POA: Insufficient documentation

## 2020-03-13 DIAGNOSIS — E1169 Type 2 diabetes mellitus with other specified complication: Secondary | ICD-10-CM | POA: Insufficient documentation

## 2020-03-13 MED ORDER — SIMVASTATIN 40 MG PO TABS: 40.0000 mg | ORAL_TABLET | Freq: Every day | ORAL | 1 refills | Status: DC

## 2020-03-13 MED ORDER — HYDROCHLOROTHIAZIDE 25 MG PO TABS: 25.0000 mg | ORAL_TABLET | Freq: Every day | ORAL | 1 refills | Status: DC

## 2020-03-13 MED ORDER — LISINOPRIL 40 MG PO TABS: 40.0000 mg | ORAL_TABLET | Freq: Every day | ORAL | 1 refills | Status: DC

## 2020-03-13 MED ORDER — METFORMIN HCL ER (MOD) 1000 MG PO TB24: 1000.0000 mg | ORAL_TABLET | Freq: Two times a day (BID) | ORAL | 1 refills | Status: DC

## 2020-03-13 NOTE — Telephone Encounter (Signed)
triamcinolone (KENALOG) 0.1 % [[id number]]    Order Details Dose: 1 application Route: Topical Frequency: 2 times daily  Dispense Quantity: 30 g Refills: 0        Sig: Apply 1 application topically 2 (two) times daily.       Start Date: 03/10/20 End Date: --  Written Date: 03/10/20 Expiration Date: 03/10/21     Diagnosis Association: Herpes zoster without complication (B02.9)   Providers  Ordering and Authorizing Provider:   Carlean Jews, NP  [address] [place], [place] [address]  Phone:  [phone]   Fax:  [phone]  DEA #:  [id number]   NPI:  [id number]      Ordering User:  Sylvester Harder, CMA       Pharmacy  [facility] [address] [place] - [address]  [address] [place] [address]  Phone:  [phone]  Fax:  [phone]  DEA #:  --

## 2020-03-14 MED ORDER — METFORMIN HCL 500 MG PO TABS: 1000.0000 mg | ORAL_TABLET | Freq: Two times a day (BID) | ORAL | 0 refills | Status: DC

## 2020-03-14 MED ORDER — TRIAMCINOLONE ACETONIDE 0.1 % EX CREA: 1.0000 "application " | TOPICAL_CREAM | Freq: Two times a day (BID) | CUTANEOUS | 0 refills | Status: DC

## 2020-03-14 NOTE — Addendum Note (Signed)
Addended by: Sylvester Harder on: 03/14/2020 04:45 PM   Modules accepted: Orders

## 2020-03-15 ENCOUNTER — Telehealth: Payer: Self-pay | Admitting: Nurse Practitioner

## 2020-03-15 NOTE — Telephone Encounter (Signed)
Patient called in stating the metformin sent in was not the same as he usually gets a 90 day supply. It appears a 360 day supply was sent in and with his insurance it was too expensive. Patient also needs his triamcinolone sent in as a cream as the pharmacy states it comes in gel and cream and it was not specified, it needs to be the cream. The pharmacy is [facility] [address]. Thanks

## 2020-03-15 NOTE — Telephone Encounter (Signed)
Left msg for patient to call back. AS, CMA 

## 2020-03-15 NOTE — Telephone Encounter (Signed)
Patient advised medication sent to pharmacy. AS, CMA

## 2020-03-20 ENCOUNTER — Other Ambulatory Visit: Payer: Self-pay | Admitting: Nurse Practitioner

## 2020-03-20 DIAGNOSIS — B029 Zoster without complications: Secondary | ICD-10-CM

## 2020-04-20 ENCOUNTER — Telehealth: Payer: Self-pay | Admitting: Nurse Practitioner

## 2020-04-20 NOTE — Telephone Encounter (Signed)
Patient is experiencing cold and flu symptoms- please call and advise- I advised a telehealth visit for today- front desk will add to schedule

## 2020-04-20 NOTE — Telephone Encounter (Signed)
Yes, telehealth. Unless they had negative covid test. Is there room on schedule?

## 2020-06-03 ENCOUNTER — Other Ambulatory Visit: Payer: Self-pay

## 2020-06-03 ENCOUNTER — Encounter (HOSPITAL_COMMUNITY): Payer: Self-pay | Admitting: Emergency Medicine

## 2020-06-03 ENCOUNTER — Ambulatory Visit (HOSPITAL_COMMUNITY)
Admission: EM | Admit: 2020-06-03 | Discharge: 2020-06-03 | Disposition: A | Discharge status: Home / Self Care | Payer: Medicare Other | Attending: Physician Assistant | Admitting: Physician Assistant

## 2020-06-03 DIAGNOSIS — S60561A Insect bite (nonvenomous) of right hand, initial encounter: Secondary | ICD-10-CM

## 2020-06-03 DIAGNOSIS — L089 Local infection of the skin and subcutaneous tissue, unspecified: Secondary | ICD-10-CM | POA: Diagnosis not present

## 2020-06-03 DIAGNOSIS — W57XXXA Bitten or stung by nonvenomous insect and other nonvenomous arthropods, initial encounter: Secondary | ICD-10-CM

## 2020-06-03 MED ORDER — HYDROCORTISONE 2.5 % EX LOTN: TOPICAL_LOTION | Freq: Two times a day (BID) | CUTANEOUS | 0 refills | Status: DC

## 2020-06-03 MED ORDER — DOXYCYCLINE HYCLATE 100 MG PO CAPS: 100.0000 mg | ORAL_CAPSULE | Freq: Two times a day (BID) | ORAL | 0 refills | Status: AC

## 2020-06-03 NOTE — Discharge Instructions (Addendum)
Take doxycycline twice daily for 10 days to cover for skin infection.  Do not stay in the sun while on this medication as it can give you a very bad sunburn.  Use hydrocortisone cream twice daily as needed for itching symptoms.  You can alternate Zyrtec and Pepcid for additional symptom relief.  If area of redness spreads at all despite antibiotics you need to be reevaluated.

## 2020-06-03 NOTE — ED Provider Notes (Signed)
[facility]    CSN: [id number] Arrival date & time: 06/03/20  1154      History   Chief Complaint Chief Complaint  Patient presents with  . Insect Bite    HPI Lucas Silva is a 75 y.o. male.   Patient presents today with a 4-day history of pruritic skin lesions on right hand.  Reports he was bitten by something that he does not know what type of insect when he was working in the garden on Wednesday.  Since that time lesions have been intensely pruritic and he has developed surrounding erythema that has begun to spread.  He denies any associated pain.  He does report some swelling and had difficulty with extension of phalanges when he first woke up this morning as result of this.  He does have a history of diabetes reports blood sugars are well controlled.  Denies recurrent skin infections, history of MRSA, immunosuppression.  Denies any recent antibiotic use.  Denies any fever, shortness of breath, nausea, vomiting, headaches, dizziness.     Past Medical History:  Diagnosis Date  . Diabetes mellitus without complication (HCC)   . Hyperlipidemia   . Hypertension     Patient Active Problem List   Diagnosis Date Noted  . Encounter to establish care 03/13/2020  . Herpes zoster without complication 03/13/2020  . Hyperlipidemia due to type 2 diabetes mellitus (HCC) 03/13/2020  . 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 Type 2 diabetes mellitus without complication, with long-term current use of insulin (HCC) 03/13/2020    Past Surgical History:  Procedure Laterality Date  . HERNIA REPAIR         Home Medications    Prior to Admission medications   Medication Sig Start Date End Date Taking? Authorizing Provider  doxycycline (VIBRAMYCIN) 100 MG capsule Take 1 capsule (100 mg total) by mouth 2 (two) times daily. 06/03/20  Yes Josanna Hefel K, PA-C  hydrocortisone 2.5 % lotion Apply topically 2 (two) times  daily. 06/03/20  Yes Shawntee Mainwaring, Noberto Retort, PA-C  acetaminophen (TYLENOL) 325 MG tablet Take 2 tablets (650 mg total) by mouth every 6 (six) hours as needed for mild pain or moderate pain. 10/07/15   Everlene Farrier, PA-C  aspirin 81 MG tablet Take 81 mg by mouth daily.    [provider]  cholecalciferol (VITAMIN D) 1000 units tablet Take 1,000 Units by mouth daily.    [provider]  hydrochlorothiazide (HYDRODIURIL) 25 MG tablet Take 1 tablet (25 mg total) by mouth daily. 03/13/20   Carlean Jews, NP  insulin aspart protamine- aspart (NOVOLOG MIX 70/30) (70-30) 100 UNIT/ML injection Inject 40 Units into the skin daily.     [provider]  lisinopril (ZESTRIL) 40 MG tablet Take 1 tablet (40 mg total) by mouth daily. 03/13/20   Carlean Jews, NP  metFORMIN (GLUCOPHAGE) 500 MG tablet Take 2 tablets (1,000 mg total) by mouth 2 (two) times daily with a meal. 03/14/20   Boscia, Kathlynn Grate, NP  methocarbamol (ROBAXIN) 500 MG tablet Take 1 tablet (500 mg total) by mouth 2 (two) times daily as needed for muscle spasms. 10/07/15   Everlene Farrier, PA-C  Semaglutide,0.25 or 0.5MG /DOS, (OZEMPIC, 0.25 OR 0.5 MG/DOSE,) 2 MG/1.5ML SOPN Inject into the skin.    [provider]  simvastatin (ZOCOR) 40 MG tablet Take 1 tablet (40 mg total) by mouth daily. 03/13/20   Carlean Jews, NP  triamcinolone (KENALOG)  0.1 % Apply 1 application topically 2 (two) times daily. 03/14/20   Carlean Jews, NP  valACYclovir (VALTREX) 1000 MG tablet Take 1 tablet (1,000 mg total) by mouth 2 (two) times daily. 03/10/20   Carlean Jews, NP    Family History Family History  Problem Relation Age of Onset  . Diabetes Mother   . Stroke Father     Social History Social History   Tobacco Use  . Smoking status: Never Smoker  . Smokeless tobacco: Never Used  Vaping Use  . Vaping Use: Never used  Substance Use Topics  . Alcohol use: No    Alcohol/week: 0.0 standard drinks  . Drug use: No      Allergies   Patient has no known allergies.   Review of Systems Review of Systems  Constitutional: Negative for activity change, appetite change, fatigue and fever.  Respiratory: Negative for cough and shortness of breath.   Cardiovascular: Negative for chest pain.  Gastrointestinal: Negative for abdominal pain, diarrhea, nausea and vomiting.  Musculoskeletal: Negative for arthralgias and myalgias.  Skin: Positive for color change and wound.  Neurological: Negative for dizziness, light-headedness and headaches.     Physical Exam Triage Vital Signs ED Triage Vitals  Enc Vitals Group     BP 06/03/20 1430 (!) 149/86     Pulse Rate 06/03/20 1430 83     Resp 06/03/20 1430 18     Temp 06/03/20 1430 98 F (36.7 C)     Temp Source 06/03/20 1430 Oral     SpO2 06/03/20 1430 97 %     Weight --      Height --      Head Circumference --      Peak Flow --      Pain Score 06/03/20 1427 0     Pain Loc --      Pain Edu? --      Excl. in GC? --    No data found.  Updated Vital Signs BP (!) 149/86 (BP Location: Right Arm)   Pulse 83   Temp 98 F (36.7 C) (Oral)   Resp 18   SpO2 97%   Visual Acuity Right Eye Distance:   Left Eye Distance:   Bilateral Distance:    Right Eye Near:   Left Eye Near:    Bilateral Near:     Physical Exam Vitals reviewed.  Constitutional:      General: He is awake.     Appearance: Normal appearance. He is normal weight. He is not ill-appearing.     Comments: Very Lucas male appears stated age in no acute distress  HENT:     Head: Normocephalic and atraumatic.  Cardiovascular:     Rate and Rhythm: Normal rate and regular rhythm.     Heart sounds: No murmur heard.   Pulmonary:     Effort: Pulmonary effort is normal.     Breath sounds: Normal breath sounds. No stridor. No wheezing, rhonchi or rales.     Comments: Clear to auscultation bilaterally Abdominal:     General: Bowel sounds are normal.     Palpations: Abdomen is soft.      Tenderness: There is no abdominal tenderness.  Skin:    Findings: Erythema and wound present.     Comments: Multiple 1 cm lesions noted right hand with surrounding erythema.  No drainage or bleeding noted.  No streaking or evidence of lymphangitis.  Neurological:     Mental Status: He is alert.  Psychiatric:        Behavior: Behavior is cooperative.      UC Treatments / Results  Labs (all labs ordered are listed, but only abnormal results are displayed) Labs Reviewed - No data to display  EKG   Radiology No results found.  Procedures Procedures (including critical care time)  Medications Ordered in UC Medications - No data to display  Initial Impression / Assessment and Plan / UC Course  I have reviewed the triage vital signs and the nursing notes.  Pertinent labs & imaging results that were available during my care of the patient were reviewed by me and considered in my medical decision making (see chart for details).     Given surrounding erythema that has spread we will start doxycycline to cover for cellulitis.  Patient was instructed to stay out of the sun while on this medication due to photosensitivity associated with this medicine.  He was prescribed hydrocortisone cream to be used for pruritus and recommended to keep lesions clean and dressed.  He can alternate Zyrtec and Pepcid for additional management of pruritus.  Discussed consult symptoms of infection spreading that warrant reevaluation to which patient expressed understanding.  Strict return precautions given to which patient expressed understanding.  Final Clinical Impressions(s) / UC Diagnoses   Final diagnoses:  Bug bite with infection, initial encounter  Skin infection  Insect bite of right hand, initial encounter     Discharge Instructions     Take doxycycline twice daily for 10 days to cover for skin infection.  Do not stay in the sun while on this medication as it can give you a very bad  sunburn.  Use hydrocortisone cream twice daily as needed for itching symptoms.  You can alternate Zyrtec and Pepcid for additional symptom relief.  If area of redness spreads at all despite antibiotics you need to be reevaluated.    ED Prescriptions    Medication Sig Dispense Auth. Provider   doxycycline (VIBRAMYCIN) 100 MG capsule Take 1 capsule (100 mg total) by mouth 2 (two) times daily. 20 capsule Daris Aristizabal K, PA-C   hydrocortisone 2.5 % lotion Apply topically 2 (two) times daily. 59 mL Saara Kijowski K, PA-C     PDMP not reviewed this encounter.   Jeani Hawking, PA-C 06/03/20 1526

## 2020-06-03 NOTE — ED Triage Notes (Signed)
Patient reports itching, ?insect bite on wednesday.  Patient has 2-3 possible sites on right hand: wrist, hand and ring finger .  Area is red and does itch.   Has used benadryl cream at home

## 2020-06-05 ENCOUNTER — Telehealth: Payer: Self-pay

## 2020-06-05 NOTE — Telephone Encounter (Signed)
Pt was recently seen in the ED for a Insect bite called pt for follow up

## 2020-06-05 NOTE — Telephone Encounter (Signed)
Ok, Thank you! 

## 2020-06-05 NOTE — Telephone Encounter (Signed)
Left voicemail for patient letting them know to call and schedule

## 2020-06-13 ENCOUNTER — Other Ambulatory Visit: Payer: Self-pay | Admitting: Nurse Practitioner

## 2020-06-13 DIAGNOSIS — Z794 Long term (current) use of insulin: Secondary | ICD-10-CM

## 2020-06-13 DIAGNOSIS — E119 Type 2 diabetes mellitus without complications: Secondary | ICD-10-CM

## 2020-09-08 ENCOUNTER — Ambulatory Visit: Payer: Medicare Other | Admitting: Nurse Practitioner

## 2020-09-12 ENCOUNTER — Ambulatory Visit (INDEPENDENT_AMBULATORY_CARE_PROVIDER_SITE_OTHER): Payer: Medicare Other | Admitting: Nurse Practitioner

## 2020-09-12 ENCOUNTER — Other Ambulatory Visit: Payer: Self-pay

## 2020-09-12 ENCOUNTER — Encounter: Payer: Self-pay | Admitting: Nurse Practitioner

## 2020-09-12 VITALS — BP 115/75 | HR 84 | Temp 97.7°F | Ht 71.0 in | Wt 223.6 lb

## 2020-09-12 DIAGNOSIS — E1122 Type 2 diabetes mellitus with diabetic chronic kidney disease: Secondary | ICD-10-CM

## 2020-09-12 DIAGNOSIS — E1169 Type 2 diabetes mellitus with other specified complication: Secondary | ICD-10-CM

## 2020-09-12 DIAGNOSIS — Z794 Long term (current) use of insulin: Secondary | ICD-10-CM

## 2020-09-12 DIAGNOSIS — E119 Type 2 diabetes mellitus without complications: Secondary | ICD-10-CM

## 2020-09-12 DIAGNOSIS — Z0000 Encounter for general adult medical examination without abnormal findings: Secondary | ICD-10-CM

## 2020-09-12 DIAGNOSIS — E785 Hyperlipidemia, unspecified: Secondary | ICD-10-CM

## 2020-09-12 DIAGNOSIS — I129 Hypertensive chronic kidney disease with stage 1 through stage 4 chronic kidney disease, or unspecified chronic kidney disease: Secondary | ICD-10-CM

## 2020-09-12 MED ORDER — LISINOPRIL 40 MG PO TABS: 40.0000 mg | ORAL_TABLET | Freq: Every day | ORAL | 1 refills | Status: DC

## 2020-09-12 MED ORDER — METFORMIN HCL 500 MG PO TABS: ORAL_TABLET | ORAL | 0 refills | Status: DC

## 2020-09-12 MED ORDER — HYDROCHLOROTHIAZIDE 25 MG PO TABS: 25.0000 mg | ORAL_TABLET | Freq: Every day | ORAL | 1 refills | Status: DC

## 2020-09-12 MED ORDER — SIMVASTATIN 40 MG PO TABS: 40.0000 mg | ORAL_TABLET | Freq: Every day | ORAL | 1 refills | Status: DC

## 2020-09-12 NOTE — Progress Notes (Signed)
Subjective:   Lucas Silva is a 75 y.o. male who presents for Medicare Annual/Subsequent preventive examination.  H[facility] of her type 2 diabetes.  He goes every 3 months or more often.  He has her eyes examined but there.  He also has diabetic foot exam every 3 months.  He has nutrition, family, and hearing checked at the [facility].  His medications, other than insulin, from [facility].  Insulin does come from [facility].  He states she does need refills for metformin, blood pressure medications, and cholesterol medications.  He will get recent progress notes and lab results for us to review and update his chart. He has no new concerns or complaints today.  He denies chest pain, chest pressure, or shortness of breath. He denies headaches or visual disturbances. He denies abdominal pain, nausea, vomiting, or changes in bowel or bladder habits.    Review of Systems    Review of Systems  Constitutional:  Negative for fever, malaise/fatigue and weight loss.  HENT:  Negative for congestion, ear discharge, ear pain, hearing loss and sore throat.        [facility]  Eyes: Negative.   Respiratory:  Negative for cough, shortness of breath and wheezing.   Cardiovascular:  Negative for chest pain, palpitations, orthopnea and leg swelling.  Gastrointestinal:  Negative for abdominal pain, blood in stool, constipation, diarrhea, nausea and vomiting.  Genitourinary: Negative.  Negative for dysuria, flank pain, frequency and urgency.  Musculoskeletal:  Negative for back pain, myalgias and neck pain.  Skin:  Negative for itching and rash.  Neurological:  Negative for dizziness, weakness and headaches.  Psychiatric/Behavioral:  Negative for depression.          Objective:   Physical Exam Vitals and nursing note reviewed.  Constitutional:      Appearance: Normal appearance. He is well-developed.  HENT:     Head:  Normocephalic and atraumatic.     Right Ear: Tympanic membrane, ear canal and external ear normal.     Left Ear: Tympanic membrane, ear canal and external ear normal.     Nose: Nose normal.     Mouth/Throat:     Mouth: Mucous membranes are moist.     Pharynx: Oropharynx is clear.  Eyes:     Extraocular Movements: Extraocular movements intact.     Conjunctiva/sclera: Conjunctivae normal.     Pupils: Pupils are equal, round, and reactive to light.  Neck:     Vascular: No carotid bruit.  Cardiovascular:     Rate and Rhythm: Normal rate and regular rhythm.     Pulses: Normal pulses.     Heart sounds: Normal heart sounds.  Pulmonary:     Effort: Pulmonary effort is normal.     Breath sounds: Normal breath sounds.  Abdominal:     General: Bowel sounds are normal. There is no distension.     Palpations: Abdomen is soft. There is no mass.     Tenderness: There is no abdominal tenderness. There is no guarding.     Hernia: No hernia is present.  Musculoskeletal:        General: Normal range of motion.     Cervical back: Normal range of motion and neck supple.  Lymphadenopathy:     Cervical: No cervical adenopathy.  Skin:    General: Skin is warm and dry.     Capillary Refill: Capillary refill takes  less than 2 seconds.  Neurological:     General: No focal deficit present.     Mental Status: He is alert and oriented to person, place, and time.     Cranial Nerves: No cranial nerve deficit.     Sensory: No sensory deficit.     Motor: No weakness.     Coordination: Coordination normal.     Gait: Gait normal.     Deep Tendon Reflexes: Reflexes normal.  Psychiatric:        Mood and Affect: Mood normal.        Behavior: Behavior normal.        Thought Content: Thought content normal.        Judgment: Judgment normal.    Today's Vitals   09/12/20 0942  BP: 115/75  Pulse: 84  Temp: 97.7 F (36.5 C)  SpO2: 100%  Weight: 223 lb 9.6 oz (101.4 kg)  Height: 5\' 11"  (1.803 m)   Body  mass index is 31.19 kg/m.  Advanced Directives 10/07/2015  Does Patient Have a Medical Advance Directive? No  Would patient like information on creating a medical advance directive? No - patient declined information    Current Medications (verified) Outpatient Encounter Medications as of 09/12/2020  Medication Sig   [DISCONTINUED] aspirin 81 MG chewable tablet CHEW ONE TABLET BY MOUTH IN THE MORNING   [DISCONTINUED] Semaglutide, 1 MG/DOSE, 2 MG/1.5ML SOPN 1 mg.   acetaminophen (TYLENOL) 325 MG tablet Take 2 tablets (650 mg total) by mouth every 6 (six) hours as needed for mild pain or moderate pain.   aspirin 81 MG tablet Take 81 mg by mouth daily.   cholecalciferol (VITAMIN D) 1000 units tablet Take 1,000 Units by mouth daily.   doxycycline (VIBRAMYCIN) 100 MG capsule Take 1 capsule (100 mg total) by mouth 2 (two) times daily.   hydrochlorothiazide (HYDRODIURIL) 25 MG tablet Take 1 tablet (25 mg total) by mouth daily.   hydrocortisone 2.5 % lotion Apply topically 2 (two) times daily.   insulin aspart protamine- aspart (NOVOLOG MIX 70/30) (70-30) 100 UNIT/ML injection Inject 40 Units into the skin daily.    lisinopril (ZESTRIL) 40 MG tablet Take 1 tablet (40 mg total) by mouth daily.   metFORMIN (GLUCOPHAGE) 500 MG tablet TAKE 2 TABLETS BY MOUTH TWICE DAILY WITH  A  MEAL   methocarbamol (ROBAXIN) 500 MG tablet Take 1 tablet (500 mg total) by mouth 2 (two) times daily as needed for muscle spasms.   Semaglutide,0.25 or 0.5MG /DOS, (OZEMPIC, 0.25 OR 0.5 MG/DOSE,) 2 MG/1.5ML SOPN Inject into the skin.   simvastatin (ZOCOR) 40 MG tablet Take 1 tablet (40 mg total) by mouth daily.   triamcinolone (KENALOG) 0.1 % Apply 1 application topically 2 (two) times daily.   valACYclovir (VALTREX) 1000 MG tablet Take 1 tablet (1,000 mg total) by mouth 2 (two) times daily.   [DISCONTINUED] hydrochlorothiazide (HYDRODIURIL) 25 MG tablet Take 1 tablet (25 mg total) by mouth daily.   [DISCONTINUED] lisinopril  (ZESTRIL) 40 MG tablet Take 1 tablet (40 mg total) by mouth daily.   [DISCONTINUED] metFORMIN (GLUCOPHAGE) 500 MG tablet TAKE 2 TABLETS BY MOUTH TWICE DAILY WITH  A  MEAL   [DISCONTINUED] simvastatin (ZOCOR) 40 MG tablet Take 1 tablet (40 mg total) by mouth daily.   No facility-administered encounter medications on file as of 09/12/2020.    Allergies (verified) Patient has no known allergies.   History: Past Medical History:  Diagnosis Date   Diabetes mellitus without complication (HCC)  Hyperlipidemia    Hypertension    Past Surgical History:  Procedure Laterality Date   HERNIA REPAIR     Family History  Problem Relation Age of Onset   Diabetes Mother    Stroke Father    Social History   Socioeconomic History   Marital status: Married    Spouse name: Not on file   Number of children: Not on file   Years of education: Not on file   Highest education level: Not on file  Occupational History   Not on file  Tobacco Use   Smoking status: Never   Smokeless tobacco: Never  Vaping Use   Vaping Use: Never used  Substance and Sexual Activity   Alcohol use: No    Alcohol/week: 0.0 standard drinks   Drug use: No   Sexual activity: Not on file  Other Topics Concern   Not on file  Social History Narrative   Not on file   Social Determinants of Health   Financial Resource Strain: Not on file  Food Insecurity: Not on file  Transportation Needs: Not on file  Physical Activity: Not on file  Stress: Not on file  Social Connections: Not on file    Tobacco Counseling Patient is a nonn-smoker     Diabetic?yes         Activities of Daily Living In your present state of health, do you have any difficulty performing the following activities: 09/12/2020 03/10/2020  Hearing? Y N  Vision? N N  Difficulty concentrating or making decisions? N N  Walking or climbing stairs? N N  Dressing or bathing? N N  Doing errands, shopping? N N  Some recent data might be hidden     Patient Care Team: Carlean Jews, NP as PCP - General (Family Medicine)  Indicate any recent Medical Services you may have received from other than [facility] providers in the past year (date may be approximate).     Assessment:  1. Encounter for Medicare annual wellness exam The patient presents for annual Medicare wellness exam.  2. Type 2 diabetes mellitus without complication, with long-term current use of insulin (HCC) Currently treated for diabetes through [place] [facility].  Will remain on metformin.  Gets insulin and other diabetic medications from [place].  He will obtain lab results and progress notes providers at [facility] so I can update our results.  No medication changes made today. - metFORMIN (GLUCOPHAGE) 500 MG tablet; TAKE 2 TABLETS BY MOUTH TWICE DAILY WITH  A  MEAL  Dispense: 360 tablet; Refill: 0  3. 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  Continue medications as prescribed.  Refill sent to [facility]. - hydrochlorothiazide (HYDRODIURIL) 25 MG tablet; Take 1 tablet (25 mg total) by mouth daily.  Dispense: 90 tablet; Refill: 1 - lisinopril (ZESTRIL) 40 MG tablet; Take 1 tablet (40 mg total) by mouth daily.  Dispense: 90 tablet; Refill: 1  4. Hyperlipidemia due to type 2 diabetes mellitus (HCC) Will get most recent labs and progress notes from [place] [facility] to review and update his chart.  Continue simvastatin as prescribed.  Will adjust as indicated. - simvastatin (ZOCOR) 40 MG tablet; Take 1 tablet (40 mg total) by mouth daily.  Dispense: 90 tablet; Refill: 1   Hearing/Vision screen Hearing and vision screens both done through [place] [facility]    Depression Screen PHQ 2/9 Scores 09/12/2020 03/10/2020 05/24/2017 01/14/2015  PHQ - 2 Score 0 0 0 0  PHQ- 9 Score 0 0 - -    Fall Risk Fall Risk  09/12/2020 03/10/2020 05/24/2017  Falls in the past year? 0 0 No  Number falls in past yr: 0 - -  Injury with Fall? 0 - -  Risk for fall  due to : No Fall Risks - -  Follow up Falls evaluation completed - -    FALL RISK PREVENTION PERTAINING TO THE HOME:  Any stairs in or around the home? Yes  If so, are there any without handrails? No  Home free of loose throw rugs in walkways, pet beds, electrical cords, etc? No  Adequate lighting in your home to reduce risk of falls? Yes   ASSISTIVE DEVICES UTILIZED TO PREVENT FALLS:  Life alert? No  Use of a cane, walker or w/c? No  Grab bars in the bathroom? No  Shower chair or bench in shower? No  Elevated toilet seat or a handicapped toilet? No   TIMED UP AND GO:  Was the test performed? Yes .  Length of time to ambulate 10 feet: 12 sec.   Gait slow and steady without use of assistive device  Cognitive Function:     6CIT Screen 09/12/2020  What Year? 0 points  What month? 0 points  What time? 0 points  Count back from 20 0 points  Months in reverse 0 points  Repeat phrase 0 points  Total Score 0    Immunizations Immunization History  Administered Date(s) Administered   Fluad Quad(high Dose 65+) 10/10/2016, 10/21/2017, 09/23/2019   H1N1 01/26/2008   Influenza Split 10/15/2014, 10/15/2015, 10/07/2016   Influenza, High Dose Seasonal PF 09/21/2012, 10/05/2013, 11/22/2014, 10/09/2015, 10/14/2017, 09/22/2018   Influenza, Seasonal, Injecte, Preservative Fre 09/14/2013   Influenza-Unspecified 10/14/2000, 11/08/2003, 03/06/2005, 11/11/2006, 01/26/2008, 03/28/2009, 09/14/2009, 10/25/2009, 09/26/2010, 09/15/2018, 09/26/2019   Moderna Sars-Covid-2 Vaccination 03/03/2019, 03/31/2019, 12/04/2019   Pneumococcal Conjugate-13 08/23/2014, 10/05/2015   Pneumococcal Polysaccharide-23 12/27/2011, 10/06/2012, 12/27/2016   Pneumococcal-Unspecified 04/15/2000   Tdap 01/23/2011, 12/27/2011   Zoster Recombinat (Shingrix) 06/14/2016, 10/21/2017, 01/05/2018    TDAP status: Up to date  Flu Vaccine status: Due, Education has been provided regarding the importance of this vaccine.  Advised may receive this vaccine at local pharmacy or Health Dept. Aware to provide a copy of the vaccination record if obtained from local pharmacy or Health Dept. Verbalized acceptance and understanding.  Pneumococcal vaccine status: Up to date  Covid-19 vaccine status: Completed vaccines  Qualifies for Shingles Vaccine? Yes   Zostavax completed No   Shingrix Completed?: Yes  Screening Tests Health Maintenance  Topic Date Due   HEMOGLOBIN A1C  Never done   OPHTHALMOLOGY EXAM  Never done   Hepatitis C Screening  Never done   COLONOSCOPY (Pts 45-105yrs Insurance coverage will need to be confirmed)  Never done   COVID-19 Vaccine (4 - Booster for Moderna series) 02/26/2020   INFLUENZA VACCINE  08/14/2020   FOOT EXAM  09/12/2021   TETANUS/TDAP  12/26/2021   PNA vac Low Risk Adult  Completed   Zoster Vaccines- Shingrix  Completed   HPV VACCINES  Aged Out    Health Maintenance  Health Maintenance Due  Topic Date Due   HEMOGLOBIN A1C  Never done   OPHTHALMOLOGY EXAM  Never done   Hepatitis C Screening  Never done   COLONOSCOPY (Pts 45-34yrs Insurance coverage will need to be confirmed)  Never done   COVID-19 Vaccine (4 - Booster for Moderna series) 02/26/2020   INFLUENZA VACCINE  08/14/2020  Colorectal cancer screening: Type of screening: Colonoscopy. Completed 2015. Repeat every 10 years  Lung Cancer Screening: (Low Dose CT Chest recommended if Age 89-80 years, 30 pack-year currently smoking OR have quit w/in 15years.) does not qualify.   Lung Cancer Screening Referral: does not quaify  Additional Screening:  Hepatitis C Screening: does qualify; Completed   Vision Screening: Recommended annual ophthalmology exams for early detection of glaucoma and other disorders of the eye. Is the patient up to date with their annual eye exam?  Yes  Who is the provider or what is the name of the [facility] in which the patient [facility] If pt is not established with a  provider, would they like to be referred to a provider to establish care?  N/a .   Dental Screening: Recommended annual dental exams for proper oral hygiene  Community Resource Referral / Chronic Care Management: CRR required this visit?  No   CCM required this visit?  No      Plan:     I have personally reviewed and noted the following in the patient's chart:   Medical and social history Use of alcohol, tobacco or illicit drugs  Current medications and supplements including opioid prescriptions. Patient is not currently taking opioid prescriptions. Functional ability and status Nutritional status Physical activity Advanced directives List of other physicians Hospitalizations, surgeries, and ER visits in previous 12 months Vitals Screenings to include cognitive, depression, and falls Referrals and appointments  In addition, I have reviewed and discussed with patient certain preventive protocols, quality metrics, and best practice recommendations. A written personalized care plan for preventive services as well as general preventive health recommendations were provided to patient.   This note was dictated using Conservation officer, historic buildings. Rapid proofreading was performed to expedite the delivery of the information. Despite proofreading, phonetic errors will occur which are common with this voice recognition software. Please take this into consideration. If there are any concerns, please contact our office.    Carlean Jews, NP   09/21/2020

## 2020-09-12 NOTE — Patient Instructions (Signed)

## 2020-09-21 ENCOUNTER — Encounter: Payer: Self-pay | Admitting: Nurse Practitioner

## 2020-12-13 ENCOUNTER — Other Ambulatory Visit: Payer: Self-pay | Admitting: Physician Assistant

## 2020-12-13 DIAGNOSIS — E119 Type 2 diabetes mellitus without complications: Secondary | ICD-10-CM

## 2021-03-13 ENCOUNTER — Ambulatory Visit (INDEPENDENT_AMBULATORY_CARE_PROVIDER_SITE_OTHER): Payer: Medicare Other | Admitting: Nurse Practitioner

## 2021-03-13 ENCOUNTER — Other Ambulatory Visit: Payer: Self-pay

## 2021-03-13 ENCOUNTER — Encounter: Payer: Self-pay | Admitting: Nurse Practitioner

## 2021-03-13 VITALS — BP 127/81 | HR 82 | Temp 97.8°F | Ht 71.0 in | Wt 223.4 lb

## 2021-03-13 DIAGNOSIS — I129 Hypertensive chronic kidney disease with stage 1 through stage 4 chronic kidney disease, or unspecified chronic kidney disease: Secondary | ICD-10-CM | POA: Diagnosis not present

## 2021-03-13 DIAGNOSIS — E1122 Type 2 diabetes mellitus with diabetic chronic kidney disease: Secondary | ICD-10-CM

## 2021-03-13 DIAGNOSIS — E1169 Type 2 diabetes mellitus with other specified complication: Secondary | ICD-10-CM

## 2021-03-13 DIAGNOSIS — Z6831 Body mass index (BMI) 31.0-31.9, adult: Secondary | ICD-10-CM | POA: Diagnosis not present

## 2021-03-13 DIAGNOSIS — Z794 Long term (current) use of insulin: Secondary | ICD-10-CM | POA: Diagnosis not present

## 2021-03-13 DIAGNOSIS — E119 Type 2 diabetes mellitus without complications: Secondary | ICD-10-CM

## 2021-03-13 DIAGNOSIS — E785 Hyperlipidemia, unspecified: Secondary | ICD-10-CM

## 2021-03-13 LAB — POCT GLYCOSYLATED HEMOGLOBIN (HGB A1C): Hemoglobin A1C: 8 % — AB (ref 4.0–5.6)

## 2021-03-13 MED ORDER — LISINOPRIL 40 MG PO TABS: 40.0000 mg | ORAL_TABLET | Freq: Every day | ORAL | 1 refills | Status: DC

## 2021-03-13 MED ORDER — HYDROCHLOROTHIAZIDE 25 MG PO TABS: 25.0000 mg | ORAL_TABLET | Freq: Every day | ORAL | 1 refills | Status: DC

## 2021-03-13 MED ORDER — METFORMIN HCL 500 MG PO TABS: ORAL_TABLET | ORAL | 1 refills | Status: DC

## 2021-03-13 MED ORDER — SIMVASTATIN 40 MG PO TABS: 40.0000 mg | ORAL_TABLET | Freq: Every day | ORAL | 1 refills | Status: DC

## 2021-03-13 NOTE — Patient Instructions (Signed)
Fat and Cholesterol Restricted Eating Plan Getting too much fat and cholesterol in your diet may cause health problems. Choosing the right foods helps keep your fat and cholesterol at normal levels. This can keep you from getting certain diseases. Your doctor may recommend an eating plan that includes: Total fat: ______% or less of total calories a day. This is ______g of fat a day. Saturated fat: ______% or less of total calories a day. This is ______g of saturated fat a day. Cholesterol: less than _________mg a day. Fiber: ______g a day. What are tips for following this plan? General tips Work with your doctor to lose weight if you need to. Avoid: Foods with added sugar. Fried foods. Foods with trans fat or partially hydrogenated oils. This includes some margarines and baked goods. If you drink alcohol: Limit how much you have to: 0-1 drink a day for women who are not pregnant. 0-2 drinks a day for men. Know how much alcohol is in a drink. In the [place] one drink equals one 12 oz bottle of beer (355 mL), one 5 oz glass of wine (148 mL), or one 1 oz glass of hard liquor (44 mL). Reading food labels Check food labels for: Trans fats. Partially hydrogenated oils. Saturated fat (g) in each serving. Cholesterol (mg) in each serving. Fiber (g) in each serving. Choose foods with healthy fats, such as: Monounsaturated fats and polyunsaturated fats. These include olive and canola oil, flaxseeds, walnuts, almonds, and seeds. Omega-3 fats. These are found in certain fish, flaxseed oil, and ground flaxseeds. Choose grain products that have whole grains. Look for the word "whole" as the first word in the ingredient list. Cooking Cook foods using low-fat methods. These include baking, boiling, grilling, and broiling. Eat more home-cooked foods. Eat at restaurants and buffets less often. Eat less fast food. Avoid cooking using saturated fats, such as butter, cream, palm oil, palm kernel oil, and  coconut oil. Meal planning  At meals, divide your plate into four equal parts: Fill one-half of your plate with vegetables, green salads, and fruit. Fill one-fourth of your plate with whole grains. Fill one-fourth of your plate with low-fat (lean) protein foods. Eat fish that is high in omega-3 fats at least two times a week. This includes mackerel, tuna, sardines, and salmon. Eat foods that are high in fiber, such as whole grains, beans, apples, pears, berries, broccoli, carrots, peas, and barley. What foods should I eat? Fruits All fresh, canned (in natural juice), or frozen fruits. Vegetables Fresh or frozen vegetables (raw, steamed, roasted, or grilled). Green salads. Grains Whole grains, such as whole wheat or whole grain breads, crackers, cereals, and pasta. Unsweetened oatmeal, bulgur, barley, quinoa, or brown rice. Corn or whole wheat flour tortillas. Meats and other protein foods Ground beef (85% or leaner), grass-fed beef, or beef trimmed of fat. Skinless chicken or turkey. Ground chicken or turkey. Pork trimmed of fat. All fish and seafood. Egg whites. Dried beans, peas, or lentils. Unsalted nuts or seeds. Unsalted canned beans. Nut butters without added sugar or oil. Dairy Low-fat or nonfat dairy products, such as skim or 1% milk, 2% or reduced-fat cheeses, low-fat and fat-free ricotta or cottage cheese, or plain low-fat and nonfat yogurt. Fats and oils Tub margarine without trans fats. Light or reduced-fat mayonnaise and salad dressings. Avocado. Olive, canola, sesame, or safflower oils. The items listed above may not be a complete list of foods and beverages you can eat. Contact a dietitian for more information. What foods   should I avoid? Fruits Canned fruit in heavy syrup. Fruit in cream or butter sauce. Fried fruit. Vegetables Vegetables cooked in cheese, cream, or butter sauce. Fried vegetables. Grains White bread. White pasta. White rice. Cornbread. Bagels, pastries,  and croissants. Crackers and snack foods that contain trans fat and hydrogenated oils. Meats and other protein foods Fatty cuts of meat. Ribs, chicken wings, bacon, sausage, bologna, salami, chitterlings, fatback, hot dogs, bratwurst, and packaged lunch meats. Liver and organ meats. Whole eggs and egg yolks. Chicken and turkey with skin. Fried meat. Dairy Whole or 2% milk, cream, half-and-half, and cream cheese. Whole milk cheeses. Whole-fat or sweetened yogurt. Full-fat cheeses. Nondairy creamers and whipped toppings. Processed cheese, cheese spreads, and cheese curds. Fats and oils Butter, stick margarine, lard, shortening, ghee, or bacon fat. Coconut, palm kernel, and palm oils. Beverages Alcohol. Sugar-sweetened drinks such as sodas, lemonade, and fruit drinks. Sweets and desserts Corn syrup, sugars, honey, and molasses. Candy. Jam and jelly. Syrup. Sweetened cereals. Cookies, pies, cakes, donuts, muffins, and ice cream. The items listed above may not be a complete list of foods and beverages you should avoid. Contact a dietitian for more information. Summary Choosing the right foods helps keep your fat and cholesterol at normal levels. This can keep you from getting certain diseases. At meals, fill one-half of your plate with vegetables, green salads, and fruits. Eat high fiber foods, like whole grains, beans, apples, pears, berries, carrots, peas, and barley. Limit added sugar, saturated fats, alcohol, and fried foods. This information is not intended to replace advice given to you by your health care provider. Make sure you discuss any questions you have with your health care provider. Document Revised: 05/12/2020 Document Reviewed: 05/12/2020 Elsevier Patient Education  2022 [employer]  

## 2021-03-13 NOTE — Progress Notes (Signed)
Established patient visit   Patient: Lucas Silva   DOB: 1945-02-24   76 y.o. Male  MRN: [id number] Visit Date: 03/13/2021   Chief Complaint  Patient presents with   Follow-up   Hypertension   Subjective    HPI  The patient is here for routine follow up. He does get routine care through [place] system. Sees endocrinology at [facility]. They manage his diabetic medication. Today, his HgbA1c is 8.0. he states that when he saw endocrinology at last visit, his HgbA1c was 8.2. they had to drop his insulin to 44 units twice daily down from 52 units. He takes metformin and ozempic as well. Has appointment next month for diabetic eye exam. Also sees podiatrist in near future.  -no curret concerns or complaints today.    Medications: Outpatient Medications Prior to Visit  Medication Sig   acetaminophen (TYLENOL) 325 MG tablet Take 2 tablets (650 mg total) by mouth every 6 (six) hours as needed for mild pain or moderate pain.   aspirin 81 MG tablet Take 81 mg by mouth daily.   cholecalciferol (VITAMIN D) 1000 units tablet Take 1,000 Units by mouth daily.   doxycycline (VIBRAMYCIN) 100 MG capsule Take 1 capsule (100 mg total) by mouth 2 (two) times daily.   hydrocortisone 2.5 % lotion Apply topically 2 (two) times daily.   insulin aspart protamine- aspart (NOVOLOG MIX 70/30) (70-30) 100 UNIT/ML injection Inject 40 Units into the skin daily.    methocarbamol (ROBAXIN) 500 MG tablet Take 1 tablet (500 mg total) by mouth 2 (two) times daily as needed for muscle spasms.   Semaglutide,0.25 or 0.5MG /DOS, (OZEMPIC, 0.25 OR 0.5 MG/DOSE,) 2 MG/1.5ML SOPN Inject into the skin.   triamcinolone (KENALOG) 0.1 % Apply 1 application topically 2 (two) times daily.   [DISCONTINUED] hydrochlorothiazide (HYDRODIURIL) 25 MG tablet Take 1 tablet (25 mg total) by mouth daily.   [DISCONTINUED] lisinopril (ZESTRIL) 40 MG tablet Take 1 tablet (40 mg total) by mouth daily.   [DISCONTINUED] metFORMIN (GLUCOPHAGE) 500 MG tablet  TAKE 2 TABLETS BY MOUTH TWICE DAILY WITH A MEAL   [DISCONTINUED] simvastatin (ZOCOR) 40 MG tablet Take 1 tablet (40 mg total) by mouth daily.   valACYclovir (VALTREX) 1000 MG tablet Take 1 tablet (1,000 mg total) by mouth 2 (two) times daily. (Patient not taking: Reported on 03/13/2021)   No facility-administered medications prior to visit.    Review of Systems  Constitutional:  Negative for activity change, chills, fatigue and fever.  HENT:  Negative for congestion, postnasal drip, rhinorrhea, sinus pressure, sinus pain, sneezing and sore throat.   Eyes: Negative.   Respiratory:  Negative for cough, shortness of breath and wheezing.   Cardiovascular:  Negative for chest pain and palpitations.  Gastrointestinal:  Negative for constipation, diarrhea, nausea and vomiting.  Endocrine: Negative for cold intolerance, heat intolerance, polydipsia and polyuria.       Blood sugars running slightly elevated with HgbA1c at 8.0 today   Genitourinary:  Negative for dysuria, frequency and urgency.  Musculoskeletal:  Negative for back pain and myalgias.  Skin:  Negative for rash.  Allergic/Immunologic: Negative for environmental allergies.  Neurological:  Negative for dizziness, weakness and headaches.  Psychiatric/Behavioral:  The patient is not nervous/anxious.       Objective     Today's Vitals   03/13/21 1031  BP: 127/81  Pulse: 82  Temp: 97.8 F (36.6 C)  SpO2: 98%  Weight: 223 lb 6.4 oz (101.3 kg)   Body mass index is 31.16  kg/m.   BP Readings from Last 3 Encounters:  03/13/21 127/81  09/12/20 115/75  06/03/20 (!) 149/86    Wt Readings from Last 3 Encounters:  03/13/21 223 lb 6.4 oz (101.3 kg)  09/12/20 223 lb 9.6 oz (101.4 kg)  03/10/20 228 lb 4.8 oz (103.6 kg)    Physical Exam Vitals and nursing note reviewed.  Constitutional:      Appearance: Normal appearance. He is well-developed.  HENT:     Head: Normocephalic and atraumatic.     Nose: Nose normal.      Mouth/Throat:     Mouth: Mucous membranes are moist.     Pharynx: Oropharynx is clear.  Eyes:     Extraocular Movements: Extraocular movements intact.     Conjunctiva/sclera: Conjunctivae normal.     Pupils: Pupils are equal, round, and reactive to light.  Neck:     Vascular: No carotid bruit.  Cardiovascular:     Rate and Rhythm: Normal rate and regular rhythm.     Pulses: Normal pulses.     Heart sounds: Normal heart sounds.  Pulmonary:     Effort: Pulmonary effort is normal.     Breath sounds: Normal breath sounds.  Abdominal:     Palpations: Abdomen is soft.  Musculoskeletal:        General: Normal range of motion.     Cervical back: Normal range of motion and neck supple.  Lymphadenopathy:     Cervical: No cervical adenopathy.  Skin:    General: Skin is warm and dry.     Capillary Refill: Capillary refill takes less than 2 seconds.  Neurological:     General: No focal deficit present.     Mental Status: He is alert and oriented to person, place, and time.  Psychiatric:        Mood and Affect: Mood normal.        Behavior: Behavior normal.        Thought Content: Thought content normal.        Judgment: Judgment normal.     Results for orders placed or performed in visit on 03/13/21  POCT glycosylated hemoglobin (Hb A1C)  Result Value Ref Range   Hemoglobin A1C 8.0 (A) 4.0 - 5.6 %   HbA1c POC (<> result, manual entry)     HbA1c, POC (prediabetic range)     HbA1c, POC (controlled diabetic range)      Assessment & Plan    1. Type 2 diabetes mellitus without complication, with long-term current use of insulin (HCC) HgbA1c slightly elevated at 8.0, however, patient reports this as improved from recent visit with endocrinology. Will continue all diabetic medications as prescribed. He will follow up with endocrinology at [facility] in July.  - metFORMIN (GLUCOPHAGE) 500 MG tablet; Take 1 tablet po BID  Dispense: 360 tablet; Refill: 1  2. Hyperlipidemia due to type 2 diabetes  mellitus (HCC) Continue simvastatin as prescribed.  - simvastatin (ZOCOR) 40 MG tablet; Take 1 tablet (40 mg total) by mouth daily.  Dispense: 90 tablet; Refill: 1  3. 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 Continue medication as prescribed.  - hydrochlorothiazide (HYDRODIURIL) 25 MG tablet; Take 1 tablet (25 mg total) by mouth daily.  Dispense: 90 tablet; Refill: 1 - lisinopril (ZESTRIL) 40 MG tablet; Take 1 tablet (40 mg total) by mouth daily.  Dispense: 90 tablet; Refill: 1  4. Body mass index (BMI) of 31.0-31.9 in adult Encourage patient to  limit calorie intake to 2000 cal/day or less.  He should consume a low cholesterol, low-fat diet.     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)   Relevant Medications   hydrochlorothiazide (HYDRODIURIL) 25 MG tablet   lisinopril (ZESTRIL) 40 MG tablet   metFORMIN (GLUCOPHAGE) 500 MG tablet   simvastatin (ZOCOR) 40 MG tablet     Endocrine   Hyperlipidemia due to type 2 diabetes mellitus (HCC)   Relevant Medications   hydrochlorothiazide (HYDRODIURIL) 25 MG tablet   lisinopril (ZESTRIL) 40 MG tablet   metFORMIN (GLUCOPHAGE) 500 MG tablet   simvastatin (ZOCOR) 40 MG tablet   Other Relevant Orders   POCT glycosylated hemoglobin (Hb A1C) (Completed)   Type 2 diabetes mellitus without complication, with long-term current use of insulin (HCC) - Primary   Relevant Medications   lisinopril (ZESTRIL) 40 MG tablet   metFORMIN (GLUCOPHAGE) 500 MG tablet   simvastatin (ZOCOR) 40 MG tablet     Other   Body mass index (BMI) of 31.0-31.9 in adult     Return in about 6 months (around 09/10/2021) for medicare wellness, check HgbA1c.         Carlean Jews, NP  [facility] at [facility] [phone] (phone) [phone] (fax)  [facility]

## 2021-06-01 LAB — MICROALBUMIN / CREATININE URINE RATIO: Microalb Creat Ratio: 8.7

## 2021-06-01 LAB — PROTEIN / CREATININE RATIO, URINE: Creatinine, Urine: 58

## 2021-06-01 LAB — MICROALBUMIN, URINE: Microalb, Ur: 0.508

## 2021-07-09 ENCOUNTER — Other Ambulatory Visit: Payer: Self-pay | Admitting: Nurse Practitioner

## 2021-07-09 DIAGNOSIS — E119 Type 2 diabetes mellitus without complications: Secondary | ICD-10-CM

## 2021-07-09 MED ORDER — METFORMIN HCL 500 MG PO TABS: ORAL_TABLET | ORAL | 1 refills | Status: DC

## 2021-09-11 ENCOUNTER — Encounter: Payer: Medicare Other | Admitting: Nurse Practitioner

## 2021-09-13 ENCOUNTER — Ambulatory Visit (INDEPENDENT_AMBULATORY_CARE_PROVIDER_SITE_OTHER): Payer: Medicare Other | Admitting: Nurse Practitioner

## 2021-09-13 ENCOUNTER — Encounter: Payer: Self-pay | Admitting: Nurse Practitioner

## 2021-09-13 VITALS — BP 130/78 | HR 72 | Ht 71.0 in | Wt 222.0 lb

## 2021-09-13 DIAGNOSIS — E1169 Type 2 diabetes mellitus with other specified complication: Secondary | ICD-10-CM

## 2021-09-13 DIAGNOSIS — I129 Hypertensive chronic kidney disease with stage 1 through stage 4 chronic kidney disease, or unspecified chronic kidney disease: Secondary | ICD-10-CM | POA: Diagnosis not present

## 2021-09-13 DIAGNOSIS — E785 Hyperlipidemia, unspecified: Secondary | ICD-10-CM

## 2021-09-13 DIAGNOSIS — N189 Chronic kidney disease, unspecified: Secondary | ICD-10-CM

## 2021-09-13 DIAGNOSIS — Z9889 Other specified postprocedural states: Secondary | ICD-10-CM | POA: Insufficient documentation

## 2021-09-13 DIAGNOSIS — E1122 Type 2 diabetes mellitus with diabetic chronic kidney disease: Secondary | ICD-10-CM

## 2021-09-13 DIAGNOSIS — E1159 Type 2 diabetes mellitus with other circulatory complications: Secondary | ICD-10-CM

## 2021-09-13 DIAGNOSIS — Z0000 Encounter for general adult medical examination without abnormal findings: Secondary | ICD-10-CM

## 2021-09-13 DIAGNOSIS — E119 Type 2 diabetes mellitus without complications: Secondary | ICD-10-CM

## 2021-09-13 DIAGNOSIS — Z794 Long term (current) use of insulin: Secondary | ICD-10-CM

## 2021-09-13 LAB — POCT GLYCOSYLATED HEMOGLOBIN (HGB A1C): Hemoglobin A1C: 7.9 % — AB (ref 4.0–5.6)

## 2021-09-13 MED ORDER — HYDROCHLOROTHIAZIDE 25 MG PO TABS: 25.0000 mg | ORAL_TABLET | Freq: Every day | ORAL | 1 refills | Status: DC

## 2021-09-13 MED ORDER — METFORMIN HCL 500 MG PO TABS: ORAL_TABLET | ORAL | 1 refills | Status: DC

## 2021-09-13 MED ORDER — LISINOPRIL 40 MG PO TABS: 40.0000 mg | ORAL_TABLET | Freq: Every day | ORAL | 1 refills | Status: DC

## 2021-09-13 MED ORDER — SIMVASTATIN 40 MG PO TABS: 40.0000 mg | ORAL_TABLET | Freq: Every day | ORAL | 1 refills | Status: DC

## 2021-09-13 NOTE — Progress Notes (Signed)
Subjective:   Lucas Silva is a 76 y.o. male who presents for Medicare Annual/Subsequent preventive examination.  Review of Systems    Review of Systems  Constitutional:  Negative for chills, fever and malaise/fatigue.  HENT:  Negative for congestion, sinus pain and sore throat.   Eyes: Negative.   Respiratory:  Negative for cough, shortness of breath and wheezing.   Cardiovascular:  Negative for chest pain, palpitations and leg swelling.  Gastrointestinal:  Negative for constipation, diarrhea, nausea and vomiting.  Genitourinary: Negative.   Musculoskeletal:  Negative for myalgias.  Skin: Negative.   Neurological:  Negative for dizziness and headaches.  Endo/Heme/Allergies:  Does not bruise/bleed easily.       Improved blood sugar control   Psychiatric/Behavioral:  Negative for depression. The patient is not nervous/anxious.          Objective:    Today's Vitals   09/13/21 0950  BP: 130/78  Pulse: 72  SpO2: 97%  Weight: 222 lb (100.7 kg)  Height: 5\' 11"  (1.803 m)   Body mass index is 30.96 kg/m.     10/07/2015    5:01 PM  Advanced Directives  Does Patient Have a Medical Advance Directive? No  Would patient like information on creating a medical advance directive? No - patient declined information    Current Medications (verified) Outpatient Encounter Medications as of 09/13/2021  Medication Sig   acetaminophen (TYLENOL) 325 MG tablet Take 2 tablets (650 mg total) by mouth every 6 (six) hours as needed for mild pain or moderate pain.   aspirin 81 MG tablet Take 81 mg by mouth daily.   cholecalciferol (VITAMIN D) 1000 units tablet Take 1,000 Units by mouth daily.   doxycycline (VIBRAMYCIN) 100 MG capsule Take 1 capsule (100 mg total) by mouth 2 (two) times daily.   hydrocortisone 2.5 % lotion Apply topically 2 (two) times daily.   insulin aspart protamine- aspart (NOVOLOG MIX 70/30) (70-30) 100 UNIT/ML injection Inject 40 Units into the skin daily.     methocarbamol (ROBAXIN) 500 MG tablet Take 1 tablet (500 mg total) by mouth 2 (two) times daily as needed for muscle spasms.   Semaglutide,0.25 or 0.5MG /DOS, (OZEMPIC, 0.25 OR 0.5 MG/DOSE,) 2 MG/1.5ML SOPN Inject into the skin.   triamcinolone (KENALOG) 0.1 % Apply 1 application topically 2 (two) times daily.   [DISCONTINUED] hydrochlorothiazide (HYDRODIURIL) 25 MG tablet Take 1 tablet (25 mg total) by mouth daily.   [DISCONTINUED] lisinopril (ZESTRIL) 40 MG tablet Take 1 tablet (40 mg total) by mouth daily.   [DISCONTINUED] metFORMIN (GLUCOPHAGE) 500 MG tablet Take 2 tablets po BID   [DISCONTINUED] simvastatin (ZOCOR) 40 MG tablet Take 1 tablet (40 mg total) by mouth daily.   hydrochlorothiazide (HYDRODIURIL) 25 MG tablet Take 1 tablet (25 mg total) by mouth daily.   lisinopril (ZESTRIL) 40 MG tablet Take 1 tablet (40 mg total) by mouth daily.   metFORMIN (GLUCOPHAGE) 500 MG tablet Take 2 tablets po BID   simvastatin (ZOCOR) 40 MG tablet Take 1 tablet (40 mg total) by mouth daily.   valACYclovir (VALTREX) 1000 MG tablet Take 1 tablet (1,000 mg total) by mouth 2 (two) times daily. (Patient not taking: Reported on 03/13/2021)   No facility-administered encounter medications on file as of 09/13/2021.    Allergies (verified) Patient has no known allergies.   History: Past Medical History:  Diagnosis Date   Diabetes mellitus without complication (HCC)    Hyperlipidemia    Hypertension    Past Surgical History:  Procedure Laterality Date   HERNIA REPAIR     Family History  Problem Relation Age of Onset   Diabetes Mother    Stroke Father    Social History   Socioeconomic History   Marital status: Married    Spouse name: Not on file   Number of children: Not on file   Years of education: Not on file   Highest education level: Not on file  Occupational History   Not on file  Tobacco Use   Smoking status: Never   Smokeless tobacco: Never  Vaping Use   Vaping Use: Never used   Substance and Sexual Activity   Alcohol use: No    Alcohol/week: 0.0 standard drinks of alcohol   Drug use: No   Sexual activity: Not on file  Other Topics Concern   Not on file  Social History Narrative   Not on file   Social Determinants of Health   Financial Resource Strain: Low Risk  (09/13/2021)   Overall Financial Resource Strain (CARDIA)    Difficulty of Paying Living Expenses: Not very hard  Food Insecurity: No Food Insecurity (09/13/2021)   Hunger Vital Sign    Worried About Running Out of Food in the Last Year: Never true    Ran Out of Food in the Last Year: Never true  Transportation Needs: No Transportation Needs (09/13/2021)   PRAPARE - Administrator, Civil Service (Medical): No    Lack of Transportation (Non-Medical): No  Physical Activity: Sufficiently Active (09/13/2021)   Exercise Vital Sign    Days of Exercise per Week: 5 days    Minutes of Exercise per Session: 30 min  Stress: No Stress Concern Present (09/13/2021)   [employer] of Occupational Health - Occupational Stress Questionnaire    Feeling of Stress : Not at all  Social Connections: Moderately Integrated (09/13/2021)   Social Connection and Isolation Panel [NHANES]    Frequency of Communication with Friends and Family: Three times a week    Frequency of Social Gatherings with Friends and Family: More than three times a week    Attends Religious Services: 1 to 4 times per year    Active Member of [employer] or Organizations: No    Attends Banker Meetings: Never    Marital Status: Married   Physical Exam Vitals and nursing note reviewed.  Constitutional:      Appearance: Normal appearance. He is well-developed.  HENT:     Head: Normocephalic and atraumatic.     Nose: Nose normal.     Mouth/Throat:     Mouth: Mucous membranes are moist.     Pharynx: Oropharynx is clear.  Eyes:     Conjunctiva/sclera: Conjunctivae normal.     Pupils: Pupils are equal, round, and reactive  to light.  Neck:     Vascular: No carotid bruit.  Cardiovascular:     Rate and Rhythm: Normal rate and regular rhythm.     Pulses: Normal pulses.     Heart sounds: Normal heart sounds.  Pulmonary:     Effort: Pulmonary effort is normal.     Breath sounds: Normal breath sounds.  Abdominal:     Palpations: Abdomen is soft.  Musculoskeletal:        General: Normal range of motion.     Cervical back: Normal range of motion and neck supple.  Lymphadenopathy:     Cervical: No cervical adenopathy.  Skin:    General: Skin is warm and dry.  Capillary Refill: Capillary refill takes less than 2 seconds.  Neurological:     General: No focal deficit present.     Mental Status: He is alert and oriented to person, place, and time.  Psychiatric:        Mood and Affect: Mood normal.        Behavior: Behavior normal.        Thought Content: Thought content normal.        Judgment: Judgment normal.      Tobacco Counseling Counseling given: Not Answered  Diabetic?yes    Activities of Daily Living    03/13/2021   10:35 AM  In your present state of health, do you have any difficulty performing the following activities:  Hearing? 0  Vision? 0  Difficulty concentrating or making decisions? 0  Walking or climbing stairs? 0  Dressing or bathing? 0  Doing errands, shopping? 0    Patient Care Team: Carlean Jews, NP as PCP - General (Family Medicine)  Indicate any recent Medical Services you may have received from other than [facility] providers in the past year (date may be approximate).     Assessment:   1. Encounter for Medicare annual wellness exam Annual medicare wellness visit today   2. Type 2 diabetes mellitus without complication, with long-term current use of insulin (HCC) HgbA1c 7.9 today. He does see diabetic specialist at [facility]. Continue metformin  and insulin as prescribed. [facility].  - POCT glycosylated hemoglobin (Hb A1C) - metFORMIN (GLUCOPHAGE)  500 MG tablet; Take 2 tablets po BID  Dispense: 360 tablet; Refill: 1  3. 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 Contine BP medication as prescribed. Refills provided today  - hydrochlorothiazide (HYDRODIURIL) 25 MG tablet; Take 1 tablet (25 mg total) by mouth daily.  Dispense: 90 tablet; Refill: 1 - lisinopril (ZESTRIL) 40 MG tablet; Take 1 tablet (40 mg total) by mouth daily.  Dispense: 90 tablet; Refill: 1  4. Hyperlipidemia due to type 2 diabetes mellitus (HCC) Continue simvastatin as prescribed  - simvastatin (ZOCOR) 40 MG tablet; Take 1 tablet (40 mg total) by mouth daily.  Dispense: 90 tablet; Refill: 1   Hearing/Vision screen No results found.   Depression Screen    09/13/2021    9:59 AM 03/13/2021   10:35 AM 09/12/2020   10:19 AM 03/10/2020   10:33 AM 05/24/2017    9:21 AM 01/14/2015   12:01 PM  PHQ 2/9 Scores  PHQ - 2 Score 0 0 0 0 0 0  PHQ- 9 Score 0 1 0 0      Fall Risk    09/12/2020   10:18 AM 03/10/2020   10:32 AM 05/24/2017    9:21 AM  Fall Risk   Falls in the past year? 0 0 No  Number falls in past yr: 0    Injury with Fall? 0    Risk for fall due to : No Fall Risks    Follow up Falls evaluation completed      FALL RISK PREVENTION PERTAINING TO THE HOME:  Any stairs in or around the home? Yes  If so, are there any without handrails? Yes  Home free of loose throw rugs in walkways, pet beds, electrical cords, etc? Yes  Adequate lighting in your home to reduce risk of falls? Yes   ASSISTIVE DEVICES UTILIZED TO PREVENT FALLS:  Life alert? No  Use of a cane, walker or w/c? No  Grab bars in the bathroom? No  Shower chair or bench in shower? No  Elevated toilet seat or a handicapped toilet? No   TIMED UP AND GO:  Was the test performed? Yes .  Length of time to ambulate 10 feet: 10 sec.   Gait steady and fast without use of assistive device  Cognitive Function:        09/13/2021   10:00 AM 09/12/2020     9:46 AM  6CIT Screen  What Year? 0 points 0 points  What month? 0 points 0 points  What time? 0 points 0 points  Count back from 20 0 points 0 points  Months in reverse 0 points 0 points  Repeat phrase 0 points 0 points  Total Score 0 points 0 points    Immunizations Immunization History  Administered Date(s) Administered   Fluad Quad(high Dose 65+) 10/10/2016, 10/21/2017, 09/23/2019, 11/09/2020   H1N1 01/26/2008   Influenza Split 10/15/2014, 10/15/2015, 10/07/2016   Influenza, High Dose Seasonal PF 09/21/2012, 10/05/2013, 11/22/2014, 10/09/2015, 10/14/2017, 09/22/2018   Influenza, Seasonal, Injecte, Preservative Fre 09/14/2013   Influenza,inj,Quad PF,6+ Mos 09/14/2013   Influenza,trivalent, recombinat, inj, PF 12/27/2011, 10/15/2014, 10/15/2015, 10/07/2016   Influenza-Unspecified 10/14/2000, 11/08/2003, 03/06/2005, 11/11/2006, 01/26/2008, 03/28/2009, 09/14/2009, 10/25/2009, 09/26/2010, 09/15/2018, 09/26/2019   Moderna Covid-19 Vaccine Bivalent Booster 34yrs & up 11/30/2020   Moderna Sars-Covid-2 Vaccination 03/03/2019, 03/31/2019, 12/04/2019   Pneumococcal Conjugate-13 08/23/2014, 10/05/2015   Pneumococcal Polysaccharide-23 12/27/2011, 10/06/2012, 12/27/2016   Pneumococcal-Unspecified 04/15/2000   Tdap 01/23/2011, 12/27/2011, 06/01/2021   Zoster Recombinat (Shingrix) 06/14/2016, 10/21/2017, 01/05/2018    TDAP status: Up to date  Flu Vaccine status: Due, Education has been provided regarding the importance of this vaccine. Advised may receive this vaccine at local pharmacy or Health Dept. Aware to provide a copy of the vaccination record if obtained from local pharmacy or Health Dept. Verbalized acceptance and understanding.  Pneumococcal vaccine status: Due, Education has been provided regarding the importance of this vaccine. Advised may receive this vaccine at local pharmacy or Health Dept. Aware to provide a copy of the vaccination record if obtained from local pharmacy or  Health Dept. Verbalized acceptance and understanding.  Covid-19 vaccine status: Completed vaccines  Qualifies for Shingles Vaccine? Yes   Zostavax completed Yes   Shingrix Completed?: Yes  Screening Tests Health Maintenance  Topic Date Due   COLONOSCOPY (Pts 45-79yrs Insurance coverage will need to be confirmed)  Never done   INFLUENZA VACCINE  08/14/2021   COVID-19 Vaccine (5 - Moderna series) 09/29/2021 (Originally 03/30/2021)   Hepatitis C Screening  09/14/2022 (Originally 01/09/1964)   OPHTHALMOLOGY EXAM  09/14/2021   HEMOGLOBIN A1C  03/14/2022   FOOT EXAM  08/22/2022   TETANUS/TDAP  06/02/2031   Pneumonia Vaccine 74+ Years old  Completed   Zoster Vaccines- Shingrix  Completed   HPV VACCINES  Aged Out    Health Maintenance  Health Maintenance Due  Topic Date Due   COLONOSCOPY (Pts 45-68yrs Insurance coverage will need to be confirmed)  Never done   INFLUENZA VACCINE  08/14/2021    Colorectal cancer screening: Type of screening: Colonoscopy. Completed 2015. Repeat every 10 years  Lung Cancer Screening: (Low Dose CT Chest recommended if Age 56-80 years, 30 pack-year currently smoking OR have quit w/in 15years.) does not qualify.   Lung Cancer Screening Referral: no  Additional Screening:  Hepatitis C Screening: does not qualify; Completed n/a  Vision Screening: Recommended annual ophthalmology exams for early detection of glaucoma and other disorders of the eye. Is  the patient up to date with their annual eye exam?  Yes  Who is the provider or what is the name of the office in which the patient attends [facility] exams? [facility] If pt is not established with a provider, would they like to be referred to a provider to establish care? No .   Dental Screening: Recommended annual dental exams for proper oral hygiene  Community Resource Referral / Chronic Care Management: CRR required this visit?  No   CCM required this visit?  No      Plan:     I have personally  reviewed and noted the following in the patient's chart:   Medical and social history Use of alcohol, tobacco or illicit drugs  Current medications and supplements including opioid prescriptions. Patient is not currently taking opioid prescriptions. Functional ability and status Nutritional status Physical activity Advanced directives List of other physicians Hospitalizations, surgeries, and ER visits in previous 12 months Vitals Screenings to include cognitive, depression, and falls Referrals and appointments  In addition, I have reviewed and discussed with patient certain preventive protocols, quality metrics, and best practice recommendations. A written personalized care plan for preventive services as well as general preventive health recommendations were provided to patient.     Carlean Jews, NP   09/13/2021   Nurse Notes: face to face 25 min

## 2021-10-01 ENCOUNTER — Other Ambulatory Visit: Payer: Self-pay | Admitting: Nurse Practitioner

## 2021-10-01 DIAGNOSIS — E1169 Type 2 diabetes mellitus with other specified complication: Secondary | ICD-10-CM

## 2021-10-03 ENCOUNTER — Telehealth: Payer: Self-pay

## 2021-10-03 ENCOUNTER — Other Ambulatory Visit: Payer: Self-pay | Admitting: Nurse Practitioner

## 2021-10-03 DIAGNOSIS — E1169 Type 2 diabetes mellitus with other specified complication: Secondary | ICD-10-CM

## 2021-10-03 MED ORDER — SIMVASTATIN 40 MG PO TABS: 40.0000 mg | ORAL_TABLET | Freq: Every day | ORAL | 1 refills | Status: DC

## 2021-10-03 NOTE — Telephone Encounter (Signed)
Patient is calling office due to his medication simvastatin being denied and needs approval.

## 2021-10-03 NOTE — Telephone Encounter (Signed)
I'm not  sure what he means, if approval needs to come from [place] or from insurance. I did send a new 90 day prescription for this medication to [facility]

## 2021-10-04 ENCOUNTER — Telehealth: Payer: Self-pay

## 2021-10-04 NOTE — Telephone Encounter (Signed)
Called pt no answer °

## 2021-10-05 NOTE — Telephone Encounter (Signed)
Called pt LVM to contact the office °

## 2021-10-08 NOTE — Telephone Encounter (Signed)
Called pt he is advised of his Rx that was sent he was able to pick up at pharmacy

## 2022-02-16 ENCOUNTER — Encounter: Payer: Self-pay | Admitting: Emergency Medicine

## 2022-02-16 ENCOUNTER — Other Ambulatory Visit: Payer: Self-pay

## 2022-02-16 ENCOUNTER — Ambulatory Visit
Admission: EM | Admit: 2022-02-16 | Discharge: 2022-02-16 | Disposition: A | Discharge status: Home / Self Care | Payer: Medicare Other | Attending: Physician Assistant | Admitting: Physician Assistant

## 2022-02-16 DIAGNOSIS — H6123 Impacted cerumen, bilateral: Secondary | ICD-10-CM

## 2022-02-16 DIAGNOSIS — H6122 Impacted cerumen, left ear: Secondary | ICD-10-CM

## 2022-02-16 MED ORDER — OFLOXACIN 0.3 % OT SOLN: 5.0000 [drp] | Freq: Every day | OTIC | 0 refills | Status: AC

## 2022-02-16 NOTE — ED Provider Notes (Signed)
[facility]    CSN: [id number] Arrival date & time: 02/16/22  1010      History   Chief Complaint Chief Complaint  Patient presents with   Otalgia    HPI Lucas Silva is a 77 y.o. male.   Patient here today for new incident of left ear pain that is been ongoing for the last week.  He does wear hearing aids at baseline.  He denies any fever.  He has not had any nasal congestion or drainage or sore throat.  He does not report treatment for symptoms.  The history is provided by the patient.  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 Authorizing Provider  ofloxacin (FLOXIN) 0.3 % OTIC solution Place 5 drops into the left ear daily for 7 days. 02/16/22 02/23/22 Yes Francene Finders, PA-C  acetaminophen (TYLENOL) 325 MG tablet Take 2 tablets (650 mg total) by mouth every 6 (six) hours as needed for mild pain or moderate pain. 10/07/15   Waynetta Pean, PA-C  aspirin 81 MG tablet Take 81 mg by mouth daily.    [provider]  cholecalciferol (VITAMIN D) 1000 units tablet Take 1,000 Units by mouth daily.    [provider]   doxycycline (VIBRAMYCIN) 100 MG capsule Take 1 capsule (100 mg total) by mouth 2 (two) times daily. Patient not taking: Reported on 02/16/2022 06/03/20   Raspet, Derry Skill, PA-C  hydrochlorothiazide (HYDRODIURIL) 25 MG tablet Take 1 tablet (25 mg total) by mouth daily. 09/13/21   Ronnell Freshwater, NP  hydrocortisone 2.5 % lotion Apply topically 2 (two) times daily. 06/03/20   Raspet, Junie Panning K, PA-C  insulin aspart protamine- aspart (NOVOLOG MIX 70/30) (70-30) 100 UNIT/ML injection Inject 40 Units into the skin daily.     [provider]  lisinopril (ZESTRIL) 40 MG tablet Take 1 tablet (40 mg total) by mouth daily. 09/13/21   Ronnell Freshwater, NP  metFORMIN (GLUCOPHAGE) 500 MG tablet Take 2 tablets po BID 09/13/21   Ronnell Freshwater, NP  methocarbamol (ROBAXIN) 500 MG tablet Take 1 tablet (500 mg total) by mouth 2 (two) times daily as needed for muscle spasms. 10/07/15   Waynetta Pean, PA-C  Semaglutide,0.25 or 0.5MG /DOS, (OZEMPIC, 0.25 OR 0.5 MG/DOSE,) 2 MG/1.5ML SOPN Inject into the skin.    [provider]  simvastatin (ZOCOR) 40 MG tablet Take 1 tablet (40 mg total) by mouth daily. 10/03/21   Ronnell Freshwater, NP  triamcinolone (KENALOG) 0.1 % Apply 1 application topically 2 (two) times daily. 03/14/20   Ronnell Freshwater, NP  valACYclovir (  VALTREX) 1000 MG tablet Take 1 tablet (1,000 mg total) by mouth 2 (two) times daily. Patient not taking: Reported on 03/13/2021 03/10/20   Ronnell Freshwater, NP    Family History Family History  Problem Relation Age of Onset   Diabetes Mother    Stroke Father     Social History Social History   Tobacco Use   Smoking status: Never   Smokeless tobacco: Never  Vaping Use   Vaping Use: Never used  Substance Use Topics   Alcohol use: No    Alcohol/week: 0.0 standard drinks of alcohol   Drug use: No     Allergies   Patient has no known allergies.   Review of Systems Review of Systems  Constitutional:  Negative for chills and fever.   HENT:  Positive for ear pain. Negative for congestion.   Eyes:  Negative for discharge and redness.  Respiratory:  Negative for cough and shortness of breath.   Gastrointestinal:  Negative for nausea and vomiting.     Physical Exam Triage Vital Signs ED Triage Vitals  Enc Vitals Group     BP 02/16/22 1106 (!) 146/91     Pulse Rate 02/16/22 1106 83     Resp 02/16/22 1106 18     Temp 02/16/22 1106 97.6 F (36.4 C)     Temp Source 02/16/22 1106 Oral     SpO2 02/16/22 1106 97 %     Weight --      Height --      Head Circumference --      Peak Flow --      Pain Score 02/16/22 1107 5     Pain Loc --      Pain Edu? --      Excl. in [place]? --    No data found.  Updated Vital Signs BP (!) 146/91 (BP Location: Left Arm)   Pulse 83   Temp 97.6 F (36.4 C) (Oral)   Resp 18   SpO2 97%    Physical Exam Vitals and nursing note reviewed.  Constitutional:      General: He is not in acute distress.    Appearance: Normal appearance. He is not ill-appearing.  HENT:     Head: Normocephalic and atraumatic.     Right Ear: There is impacted cerumen.     Left Ear: There is impacted cerumen.     Nose: Nose normal. No congestion.  Eyes:     Conjunctiva/sclera: Conjunctivae normal.  Cardiovascular:     Rate and Rhythm: Normal rate.  Pulmonary:     Effort: Pulmonary effort is normal. No respiratory distress.  Skin:    General: Skin is warm and dry.  Neurological:     Mental Status: He is alert.  Psychiatric:        Mood and Affect: Mood normal.        Thought Content: Thought content normal.      UC Treatments / Results  Labs (all labs ordered are listed, but only abnormal results are displayed) Labs Reviewed - No data to display  EKG   Radiology No results found.  Procedures Procedures (including critical care time)  Medications Ordered in UC Medications - No data to display  Initial Impression / Assessment and Plan / UC Course  I have reviewed the triage vital  signs and the nursing notes.  Pertinent labs & imaging results that were available during my care of the patient were reviewed by me and considered in my medical  decision making (see chart for details).    Ears irrigated in office with improvement of symptoms.  Will prescribe ofloxacin drops to cover possible otitis externa given reported pain.  Encouraged follow-up if no gradual improvement or with any further concerns.  Final Clinical Impressions(s) / UC Diagnoses   Final diagnoses:  Impacted cerumen of left ear   Discharge Instructions   None    ED Prescriptions     Medication Sig Dispense Auth. Provider   ofloxacin (FLOXIN) 0.3 % OTIC solution Place 5 drops into the left ear daily for 7 days. 5 mL Francene Finders, PA-C      PDMP not reviewed this encounter.   Francene Finders, PA-C 02/16/22 1226

## 2022-02-16 NOTE — ED Triage Notes (Signed)
Pt here for left ear pain x 1 week 

## 2022-03-11 NOTE — Progress Notes (Signed)
Established patient visit   Patient: Lucas Silva   DOB: [birth year]   77 y.o. Male  MRN: [id number] Visit Date: 03/12/2022   Chief Complaint  Patient presents with   Medical Management of Chronic Issues   Diabetes    Labs done   Subjective    HPI HPI     Diabetes    Additional comments: Labs done      Last edited by Gemma Payor, CMA on 03/12/2022  9:18 AM.      Follow up  -insulin dependant type 2 diabetic -taking 70/30 mix at 40 units daily  -on ozempic - check dose -takes metformin  --recent HgbA1c done per [place] and was 8.3 --followed by [place] system for this  -recent ED visit due to impacted cerumen  --given floxin ear drops  --he states that his ear is feeling much better.  -[facility]  -He denies chest pain, chest pressure, or shortness of breath. He denies headaches or visual disturbances. He denies abdominal pain, nausea, vomiting, or changes in bowel or bladder habits.    Medications: Outpatient Medications Prior to Visit  Medication Sig Note   acetaminophen (TYLENOL) 325 MG tablet Take 2 tablets (650 mg total) by mouth every 6 (six) hours as needed for mild pain or moderate pain.    aspirin 81 MG tablet Take 81 mg by mouth daily.    cholecalciferol (VITAMIN D) 1000 units tablet Take 1,000 Units by mouth daily.    doxycycline (VIBRAMYCIN) 100 MG capsule Take 1 capsule (100 mg total) by mouth 2 (two) times daily.    insulin aspart protamine- aspart (NOVOLOG MIX 70/30) (70-30) 100 UNIT/ML injection Inject 40 Units into the skin daily. Patient taking 14 units in AM and 25 units in the evening    methocarbamol (ROBAXIN) 500 MG tablet Take 1 tablet (500 mg total) by mouth 2 (two) times daily as needed for muscle spasms.    [DISCONTINUED] hydrochlorothiazide (HYDRODIURIL) 25 MG tablet Take 1 tablet (25 mg total) by mouth daily.    [DISCONTINUED] hydrocortisone 2.5 % lotion Apply topically 2 (two) times daily.    [DISCONTINUED] lisinopril (ZESTRIL) 40 MG  tablet Take 1 tablet (40 mg total) by mouth daily.    [DISCONTINUED] metFORMIN (GLUCOPHAGE) 500 MG tablet Take 2 tablets po BID    [DISCONTINUED] Semaglutide,0.25 or 0.5MG /DOS, (OZEMPIC, 0.25 OR 0.5 MG/DOSE,) 2 MG/1.5ML SOPN Inject into the skin. 03/12/2022: per [place] provider   [DISCONTINUED] simvastatin (ZOCOR) 40 MG tablet Take 1 tablet (40 mg total) by mouth daily.    [DISCONTINUED] triamcinolone (KENALOG) 0.1 % Apply 1 application topically 2 (two) times daily.    [DISCONTINUED] valACYclovir (VALTREX) 1000 MG tablet Take 1 tablet (1,000 mg total) by mouth 2 (two) times daily.    No facility-administered medications prior to visit.    Review of Systems  All other systems reviewed and are negative.      Objective     Today's Vitals   03/12/22 0919  BP: 101/68  Pulse: 81  SpO2: 98%  Weight: 218 lb 1.9 oz (98.9 kg)  Height: 5\' 11"  (1.803 m)   Body mass index is 30.42 kg/m.  BP Readings from Last 3 Encounters:  03/12/22 101/68  02/16/22 (Abnormal) 146/91  09/13/21 130/78    Wt Readings from Last 3 Encounters:  03/12/22 218 lb 1.9 oz (98.9 kg)  09/13/21 222 lb (100.7 kg)  03/13/21 223 lb 6.4 oz (101.3 kg)    Physical Exam Vitals and nursing note  reviewed.  Constitutional:      Appearance: Normal appearance. He is well-developed.  HENT:     Head: Normocephalic and atraumatic.     Nose: Nose normal.     Mouth/Throat:     Mouth: Mucous membranes are moist.     Pharynx: Oropharynx is clear.  Eyes:     Extraocular Movements: Extraocular movements intact.     Conjunctiva/sclera: Conjunctivae normal.     Pupils: Pupils are equal, round, and reactive to light.  Neck:     Vascular: No carotid bruit.  Cardiovascular:     Rate and Rhythm: Normal rate and regular rhythm.     Pulses: Normal pulses.     Heart sounds: Normal heart sounds.  Pulmonary:     Effort: Pulmonary effort is normal.     Breath sounds: Normal breath sounds.  Abdominal:     Palpations: Abdomen is  soft.  Musculoskeletal:        General: Normal range of motion.     Cervical back: Normal range of motion and neck supple.  Lymphadenopathy:     Cervical: No cervical adenopathy.  Skin:    General: Skin is warm and dry.     Capillary Refill: Capillary refill takes less than 2 seconds.  Neurological:     General: No focal deficit present.     Mental Status: He is alert and oriented to person, place, and time.  Psychiatric:        Mood and Affect: Mood normal.        Behavior: Behavior normal.        Thought Content: Thought content normal.        Judgment: Judgment normal.     Assessment & Plan    Type 2 diabetes mellitus without complication, with long-term current use of insulin ([facility]) Assessment & Plan: Most recent HgbA1c 8.3 done per [place]. No changes made to medications, but refills were provided. [facility] managing [facility] changes.   Orders: -     Semaglutide (2 MG/DOSE); Inject 2 mg as directed once a week.  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 Continue current medication.  New prescriptions sent to his pharmacy today   Orders: -     hydroCHLOROthiazide; Take 1 tablet (25 mg total) by mouth daily.  Dispense: 90 tablet; Refill: 1 -     Lisinopril; Take 1 tablet (40 mg total) by mouth daily.  Dispense: 90 tablet; Refill: 1  Hyperlipidemia due to type 2 diabetes mellitus ([place]) Assessment & Plan: Stable. Continue current medication.   Orders: -     Simvastatin; Take 1 tablet (40 mg total) by mouth daily.  Dispense: 90 tablet; Refill: 1  Herpes zoster without complication Assessment & Plan: Persistent. Continue current medication.   Orders: -     valACYclovir HCl; Take 1 tablet (1,000 mg total) by mouth 2 (two) times daily.  Dispense: 10 tablet; Refill: 0        Return in about 6  months (around 09/10/2022) for medicare wellness.         Ronnell Freshwater, NP  [facility] [phone] (phone) [phone] (fax)  [place]

## 2022-03-12 ENCOUNTER — Ambulatory Visit (INDEPENDENT_AMBULATORY_CARE_PROVIDER_SITE_OTHER): Payer: Medicare Other | Admitting: Nurse Practitioner

## 2022-03-12 ENCOUNTER — Encounter: Payer: Self-pay | Admitting: Nurse Practitioner

## 2022-03-12 DIAGNOSIS — E1169 Type 2 diabetes mellitus with other specified complication: Secondary | ICD-10-CM | POA: Diagnosis not present

## 2022-03-12 DIAGNOSIS — B029 Zoster without complications: Secondary | ICD-10-CM | POA: Diagnosis not present

## 2022-03-12 DIAGNOSIS — E785 Hyperlipidemia, unspecified: Secondary | ICD-10-CM

## 2022-03-12 DIAGNOSIS — E119 Type 2 diabetes mellitus without complications: Secondary | ICD-10-CM

## 2022-03-12 DIAGNOSIS — Z794 Long term (current) use of insulin: Secondary | ICD-10-CM

## 2022-03-12 DIAGNOSIS — E1122 Type 2 diabetes mellitus with diabetic chronic kidney disease: Secondary | ICD-10-CM

## 2022-03-12 DIAGNOSIS — I129 Hypertensive chronic kidney disease with stage 1 through stage 4 chronic kidney disease, or unspecified chronic kidney disease: Secondary | ICD-10-CM | POA: Diagnosis not present

## 2022-03-12 MED ORDER — SIMVASTATIN 40 MG PO TABS: 40.0000 mg | ORAL_TABLET | Freq: Every day | ORAL | 1 refills | Status: DC

## 2022-03-12 MED ORDER — HYDROCHLOROTHIAZIDE 25 MG PO TABS: 25.0000 mg | ORAL_TABLET | Freq: Every day | ORAL | 1 refills | Status: DC

## 2022-03-12 MED ORDER — LISINOPRIL 40 MG PO TABS: 40.0000 mg | ORAL_TABLET | Freq: Every day | ORAL | 1 refills | Status: DC

## 2022-03-12 MED ORDER — METFORMIN HCL 500 MG PO TABS: ORAL_TABLET | ORAL | 1 refills | Status: DC

## 2022-03-12 MED ORDER — VALACYCLOVIR HCL 1 G PO TABS: 1000.0000 mg | ORAL_TABLET | Freq: Two times a day (BID) | ORAL | 0 refills | Status: AC

## 2022-03-12 MED ORDER — VALACYCLOVIR HCL 1 G PO TABS: 1000.0000 mg | ORAL_TABLET | Freq: Two times a day (BID) | ORAL | 0 refills | Status: DC

## 2022-03-12 MED ORDER — SEMAGLUTIDE (2 MG/DOSE) 8 MG/3ML ~~LOC~~ SOPN: 2.0000 mg | PEN_INJECTOR | SUBCUTANEOUS | 0 refills | Status: AC

## 2022-04-07 NOTE — Assessment & Plan Note (Signed)
Most recent HgbA1c 8.3 done per [place]. No changes made to medications, but refills were provided. [facility] is managing medication changes.

## 2022-04-07 NOTE — Assessment & Plan Note (Signed)
Stable.  Continue current medication

## 2022-04-07 NOTE — Assessment & Plan Note (Signed)
Stable. Continue current medication.  New prescriptions sent to his pharmacy today

## 2022-04-07 NOTE — Assessment & Plan Note (Signed)
Persistent. Continue current medication.

## 2022-08-05 LAB — PROTEIN / CREATININE RATIO, URINE
Albumin, U: 67.1
Creatinine, Urine: 0.3

## 2022-09-10 ENCOUNTER — Ambulatory Visit (INDEPENDENT_AMBULATORY_CARE_PROVIDER_SITE_OTHER): Payer: Medicare Other

## 2022-09-10 ENCOUNTER — Encounter: Payer: Medicare Other | Admitting: Nurse Practitioner

## 2022-09-10 VITALS — Ht 71.0 in | Wt 218.0 lb

## 2022-09-10 DIAGNOSIS — E119 Type 2 diabetes mellitus without complications: Secondary | ICD-10-CM

## 2022-09-10 DIAGNOSIS — E1169 Type 2 diabetes mellitus with other specified complication: Secondary | ICD-10-CM

## 2022-09-10 DIAGNOSIS — Z0000 Encounter for general adult medical examination without abnormal findings: Secondary | ICD-10-CM | POA: Diagnosis not present

## 2022-09-10 DIAGNOSIS — E1122 Type 2 diabetes mellitus with diabetic chronic kidney disease: Secondary | ICD-10-CM

## 2022-09-10 NOTE — Progress Notes (Signed)
Subjective:   Lucas Silva is a 77 y.o. male who presents for Medicare Annual/Subsequent preventive examination.  Visit Complete: Virtual  I connected with  Lucas Silva on 09/10/22 by a audio enabled telemedicine application and verified that I am speaking with the correct person using two identifiers.  Patient Location: Home  Provider Location: Home Office  I discussed the limitations of evaluation and management by telemedicine. The patient expressed understanding and agreed to proceed.   Review of Systems    Vital Signs: Unable to obtain new vitals due to this being a telehealth visit.  Cardiac Risk Factors include: advanced age (>20men, >33 women);diabetes mellitus;male gender;hypertension     Objective:    Today's Vitals   09/10/22 0840  Weight: 218 lb (98.9 kg)  Height: 5\' 11"  (1.803 m)   Body mass index is 30.4 kg/m.     09/10/2022    8:49 AM 10/07/2015    5:01 PM  Advanced Directives  Does Patient Have a Medical Advance Directive? Yes No  Type of Estate agent of [place];Living will   Copy of Healthcare Power of Attorney in Chart? No - copy requested   Would patient like information on creating a medical advance directive?  No - patient declined information    Current Medications (verified) Outpatient Encounter Medications as of 09/10/2022  Medication Sig   acetaminophen (TYLENOL) 325 MG tablet Take 2 tablets (650 mg total) by mouth every 6 (six) hours as needed for mild pain or moderate pain.   aspirin 81 MG tablet Take 81 mg by mouth daily.   cholecalciferol (VITAMIN D) 1000 units tablet Take 1,000 Units by mouth daily.   doxycycline (VIBRAMYCIN) 100 MG capsule Take 1 capsule (100 mg total) by mouth 2 (two) times daily. (Patient not taking: Reported on 09/10/2022)   hydrochlorothiazide (HYDRODIURIL) 25 MG tablet Take 1 tablet (25 mg total) by mouth daily.   insulin aspart protamine- aspart (NOVOLOG MIX 70/30) (70-30) 100  UNIT/ML injection Inject 40 Units into the skin daily. Patient taking 14 units in AM and 25 units in the evening   lisinopril (ZESTRIL) 40 MG tablet Take 1 tablet (40 mg total) by mouth daily.   metFORMIN (GLUCOPHAGE) 500 MG tablet Take 2 tablets po BID   methocarbamol (ROBAXIN) 500 MG tablet Take 1 tablet (500 mg total) by mouth 2 (two) times daily as needed for muscle spasms.   Semaglutide, 2 MG/DOSE, 8 MG/3ML SOPN Inject 2 mg as directed once a week.   simvastatin (ZOCOR) 40 MG tablet Take 1 tablet (40 mg total) by mouth daily.   valACYclovir (VALTREX) 1000 MG tablet Take 1 tablet (1,000 mg total) by mouth 2 (two) times daily.   No facility-administered encounter medications on file as of 09/10/2022.    Allergies (verified) Patient has no known allergies.   History: Past Medical History:  Diagnosis Date   Diabetes mellitus without complication (HCC)    Hyperlipidemia    Hypertension    Past Surgical History:  Procedure Laterality Date   HERNIA REPAIR     Family History  Problem Relation Age of Onset   Diabetes Mother    Stroke Father    Social History   Socioeconomic History   Marital status: Married    Spouse name: Not on file   Number of children: Not on file   Years of education: Not on file   Highest education level: Not on file  Occupational History   Not on file  Tobacco  Use   Smoking status: Never    Passive exposure: Never   Smokeless tobacco: Never  Vaping Use   Vaping status: Never Used  Substance and Sexual Activity   Alcohol use: No    Alcohol/week: 0.0 standard drinks of alcohol   Drug use: No   Sexual activity: Not on file  Other Topics Concern   Not on file  Social History Narrative   Not on file   Social Determinants of Health   Financial Resource Strain: Low Risk  (09/10/2022)   Overall Financial Resource Strain (CARDIA)    Difficulty of Paying Living Expenses: Not hard at all  Food Insecurity: No Food Insecurity (09/10/2022)   Hunger  Vital Sign    Worried About Running Out of Food in the Last Year: Never true    Ran Out of Food in the Last Year: Never true  Transportation Needs: No Transportation Needs (09/10/2022)   PRAPARE - Administrator, Civil Service (Medical): No    Lack of Transportation (Non-Medical): No  Physical Activity: Sufficiently Active (09/10/2022)   Exercise Vital Sign    Days of Exercise per Week: 7 days    Minutes of Exercise per Session: 30 min  Stress: No Stress Concern Present (09/10/2022)   [employer] of Occupational Health - Occupational Stress Questionnaire    Feeling of Stress : Not at all  Social Connections: Moderately Isolated (09/10/2022)   Social Connection and Isolation Panel [NHANES]    Frequency of Communication with Friends and Family: More than three times a week    Frequency of Social Gatherings with Friends and Family: More than three times a week    Attends Religious Services: Never    Database administrator or Organizations: No    Attends Engineer, structural: Never    Marital Status: Married    Tobacco Counseling Counseling given: Not Answered   Clinical Intake:  Pre-visit preparation completed: Yes  Pain : No/denies pain     BMI - recorded: 30.4 Nutritional Status: BMI > 30  Obese Nutritional Risks: None Diabetes: Yes CBG done?: Yes (CBG 139 per patient) CBG resulted in Enter/ Edit results?: Yes Did pt. bring in CBG monitor from home?: No  How often do you need to have someone help you when you read instructions, pamphlets, or other written materials from your doctor or pharmacy?: 1 - Never  Interpreter Needed?: No  Information entered by :: Theresa Mulligan LPN   Activities of Daily Living    09/10/2022    8:48 AM  In your present state of health, do you have any difficulty performing the following activities:  Hearing? 1  Comment Wears hearing aids  Vision? 0  Difficulty concentrating or making decisions? 0  Walking or  climbing stairs? 0  Dressing or bathing? 0  Doing errands, shopping? 0  Preparing Food and eating ? N  Using the Toilet? N  In the past six months, have you accidently leaked urine? N  Do you have problems with loss of bowel control? N  Managing your Medications? N  Managing your Finances? N  Housekeeping or managing your Housekeeping? N    Patient Care Team: Carlean Jews, NP as PCP - General (Family Medicine)  Indicate any recent Medical Services you may have received from other than [facility] providers in the past year (date may be approximate).     Assessment:   This is a routine wellness examination for [place].  Hearing/Vision screen Hearing Screening -  Comments:: Wears hearing aids Vision Screening - Comments:: Wears rx glasses - up to date with routine eye exams with  [facility]  Dietary issues and exercise activities discussed:     Goals Addressed               This Visit's Progress     Live to be 77 yrs old (pt-stated)         Depression Screen    03/12/2022    9:20 AM 09/13/2021    9:59 AM 03/13/2021   10:35 AM 09/12/2020   10:19 AM 03/10/2020   10:33 AM 05/24/2017    9:21 AM 01/14/2015   12:01 PM  PHQ 2/9 Scores  PHQ - 2 Score 0 0 0 0 0 0 0  PHQ- 9 Score  0 1 0 0      Fall Risk    09/10/2022    8:48 AM 03/12/2022    9:20 AM 09/12/2020   10:18 AM 03/10/2020   10:32 AM 05/24/2017    9:21 AM  Fall Risk   Falls in the past year? 1 0 0 0 No  Number falls in past yr: 0 0 0    Injury with Fall? 0 0 0    Risk for fall due to : No Fall Risks  No Fall Risks    Follow up Falls prevention discussed  Falls evaluation completed      MEDICARE RISK AT HOME: Medicare Risk at Home Any stairs in or around the home?: Yes If so, are there any without handrails?: No Home free of loose throw rugs in walkways, pet beds, electrical cords, etc?: Yes Adequate lighting in your home to reduce risk of falls?: Yes Life alert?: No Use of a cane, walker or w/c?: No Grab  bars in the bathroom?: Yes Shower chair or bench in shower?: Yes Elevated toilet seat or a handicapped toilet?: Yes  TIMED UP AND GO:  Was the test performed?  No    Cognitive Function:        09/10/2022    8:49 AM 09/13/2021   10:00 AM 09/12/2020    9:46 AM  6CIT Screen  What Year? 0 points 0 points 0 points  What month? 0 points 0 points 0 points  What time? 0 points 0 points 0 points  Count back from 20 0 points 0 points 0 points  Months in reverse 0 points 0 points 0 points  Repeat phrase 0 points 0 points 0 points  Total Score 0 points 0 points 0 points    Immunizations Immunization History  Administered Date(s) Administered   Fluad Quad(high Dose 65+) 10/10/2016, 10/21/2017, 09/23/2019, 11/09/2020   H1N1 01/26/2008   Influenza Split 10/15/2014, 10/15/2015, 10/07/2016   Influenza, High Dose Seasonal PF 09/21/2012, 10/05/2013, 11/22/2014, 10/09/2015, 10/14/2017, 09/22/2018   Influenza, Seasonal, Injecte, Preservative Fre 09/14/2013   Influenza,inj,Quad PF,6+ Mos 09/14/2013   Influenza,trivalent, recombinat, inj, PF 12/27/2011, 10/15/2014, 10/15/2015, 10/07/2016   Influenza-Unspecified 10/14/2000, 11/08/2003, 03/06/2005, 11/11/2006, 01/26/2008, 03/28/2009, 09/14/2009, 10/25/2009, 09/26/2010, 09/15/2018, 09/26/2019   Moderna Covid-19 Vaccine Bivalent Booster 34yrs & up 11/30/2020   Moderna Sars-Covid-2 Vaccination 03/03/2019, 03/31/2019, 12/04/2019   Pneumococcal Conjugate-13 08/23/2014, 10/05/2015   Pneumococcal Polysaccharide-23 12/27/2011, 10/06/2012, 12/27/2016   Pneumococcal-Unspecified 04/15/2000   Tdap 01/23/2011, 12/27/2011, 06/01/2021   Zoster Recombinant(Shingrix) 06/14/2016, 10/21/2017, 01/05/2018    TDAP status: Up to date  Flu Vaccine status: Due, Education has been provided regarding the importance of this vaccine. Advised may receive this vaccine at local pharmacy or Health  Dept. Aware to provide a copy of the vaccination record if obtained from local  pharmacy or Health Dept. Verbalized acceptance and understanding.  Pneumococcal vaccine status: Up to date  Covid-19 vaccine status: Declined, Education has been provided regarding the importance of this vaccine but patient still declined. Advised may receive this vaccine at local pharmacy or Health Dept.or vaccine clinic. Aware to provide a copy of the vaccination record if obtained from local pharmacy or Health Dept. Verbalized acceptance and understanding.  Qualifies for Shingles Vaccine? Yes   Zostavax completed Yes   Shingrix Completed?: Yes  Screening Tests Health Maintenance  Topic Date Due   Diabetic kidney evaluation - eGFR measurement  Never done   Colonoscopy  Never done   OPHTHALMOLOGY EXAM  09/14/2021   COVID-19 Vaccine (5 - 2023-24 season) 09/14/2021   HEMOGLOBIN A1C  03/14/2022   Diabetic kidney evaluation - Urine ACR  06/02/2022   INFLUENZA VACCINE  08/15/2022   FOOT EXAM  08/22/2022   Hepatitis C Screening  09/14/2022 (Originally 01/09/1964)   Medicare Annual Wellness (AWV)  09/10/2023   DTaP/Tdap/Td (4 - Td or Tdap) 06/02/2031   Pneumonia Vaccine 10+ Years old  Completed   Zoster Vaccines- Shingrix  Completed   HPV VACCINES  Aged Out    Health Maintenance  Health Maintenance Due  Topic Date Due   Diabetic kidney evaluation - eGFR measurement  Never done   Colonoscopy  Never done   OPHTHALMOLOGY EXAM  09/14/2021   COVID-19 Vaccine (5 - 2023-24 season) 09/14/2021   HEMOGLOBIN A1C  03/14/2022   Diabetic kidney evaluation - Urine ACR  06/02/2022   INFLUENZA VACCINE  08/15/2022   FOOT EXAM  08/22/2022    Colorectal cancer screening: Referral to GI placed Patient Deferred. Pt aware the office will call re: appt.  Lung Cancer Screening: (Low Dose CT Chest recommended if Age 66-80 years, 20 pack-year currently smoking OR have quit w/in 15years.) does not qualify.     Additional Screening:  Hepatitis C Screening: does qualify; Completed Deferred  Vision  Screening: Recommended annual ophthalmology exams for early detection of glaucoma and other disorders of the eye. Is the patient up to date with their annual eye exam?  Yes  Who is the provider or what is the name of the office in which the patient attends annual eye exams? [facility] If pt is not established with a provider, would they like to be referred to a provider to establish care? No .   Dental Screening: Recommended annual dental exams for proper oral hygiene  Diabetic Foot Exam: Diabetic Foot Exam: Completed 08/21/21  Community Resource Referral / Chronic Care Management:  CRR required this visit?  No   CCM required this visit?  No     Plan:     I have personally reviewed and noted the following in the patient's chart:   Medical and social history Use of alcohol, tobacco or illicit drugs  Current medications and supplements including opioid prescriptions. Patient is not currently taking opioid prescriptions. Functional ability and status Nutritional status Physical activity Advanced directives List of other physicians Hospitalizations, surgeries, and ER visits in previous 12 months Vitals Screenings to include cognitive, depression, and falls Referrals and appointments  In addition, I have reviewed and discussed with patient certain preventive protocols, quality metrics, and best practice recommendations. A written personalized care plan for preventive services as well as general preventive health recommendations were provided to patient.     Tillie Rung, LPN  09/10/2022   After Visit Summary: (MyChart) Due to this being a telephonic visit, the after visit summary with patients personalized plan was offered to patient via MyChart   Nurse Notes: None

## 2022-09-10 NOTE — Patient Instructions (Addendum)
Lucas Silva , Thank you for taking time to come for your Medicare Wellness Visit. I appreciate your ongoing commitment to your health goals. Please review the following plan we discussed and let me know if I can assist you in the future.   Referrals/Orders/Follow-Ups/Clinician Recommendations:   This is a list of the screening recommended for you and due dates:  Health Maintenance  Topic Date Due   Yearly kidney function blood test for diabetes  Never done   Colon Cancer Screening  Never done   Eye exam for diabetics  09/14/2021   COVID-19 Vaccine (5 - 2023-24 season) 09/14/2021   Hemoglobin A1C  03/14/2022   Yearly kidney health urinalysis for diabetes  06/02/2022   Flu Shot  08/15/2022   Complete foot exam   08/22/2022   Hepatitis C Screening  09/14/2022*   Medicare Annual Wellness Visit  09/10/2023   DTaP/Tdap/Td vaccine (4 - Td or Tdap) 06/02/2031   Pneumonia Vaccine  Completed   Zoster (Shingles) Vaccine  Completed   HPV Vaccine  Aged Out  *Topic was postponed. The date shown is not the original due date.    Advanced directives: (Copy Requested) Please bring a copy of your health care power of attorney and living will to the office to be added to your chart at your convenience.  Next Medicare Annual Wellness Visit scheduled for next year: Yes

## 2022-09-10 NOTE — Addendum Note (Signed)
Addended by: Saralyn Pilar on: 09/10/2022 11:09 AM   Modules accepted: Orders

## 2022-12-02 ENCOUNTER — Other Ambulatory Visit: Payer: Medicare Other

## 2022-12-02 DIAGNOSIS — E1122 Type 2 diabetes mellitus with diabetic chronic kidney disease: Secondary | ICD-10-CM

## 2022-12-02 DIAGNOSIS — I129 Hypertensive chronic kidney disease with stage 1 through stage 4 chronic kidney disease, or unspecified chronic kidney disease: Secondary | ICD-10-CM | POA: Diagnosis not present

## 2022-12-02 DIAGNOSIS — E1169 Type 2 diabetes mellitus with other specified complication: Secondary | ICD-10-CM | POA: Diagnosis not present

## 2022-12-02 DIAGNOSIS — E785 Hyperlipidemia, unspecified: Secondary | ICD-10-CM

## 2022-12-02 DIAGNOSIS — E119 Type 2 diabetes mellitus without complications: Secondary | ICD-10-CM

## 2022-12-03 LAB — CBC WITH DIFFERENTIAL/PLATELET
Basophils Absolute: 0.1 10*3/uL (ref 0.0–0.2)
Basos: 1 %
EOS (ABSOLUTE): 0.2 10*3/uL (ref 0.0–0.4)
Eos: 3 %
Hematocrit: 42.7 % (ref 37.5–51.0)
Hemoglobin: 14.2 g/dL (ref 13.0–17.7)
Immature Grans (Abs): 0 10*3/uL (ref 0.0–0.1)
Immature Granulocytes: 0 %
Lymphocytes Absolute: 1.2 10*3/uL (ref 0.7–3.1)
Lymphs: 19 %
MCH: 29 pg (ref 26.6–33.0)
MCHC: 33.3 g/dL (ref 31.5–35.7)
MCV: 87 fL (ref 79–97)
Monocytes Absolute: 0.5 10*3/uL (ref 0.1–0.9)
Monocytes: 8 %
Neutrophils Absolute: 4.4 10*3/uL (ref 1.4–7.0)
Neutrophils: 69 %
Platelets: 308 10*3/uL (ref 150–450)
RBC: 4.9 x10E6/uL (ref 4.14–5.80)
RDW: 12.3 % (ref 11.6–15.4)
WBC: 6.3 10*3/uL (ref 3.4–10.8)

## 2022-12-03 LAB — COMPREHENSIVE METABOLIC PANEL
ALT: 13 [IU]/L (ref 0–44)
AST: 19 [IU]/L (ref 0–40)
Albumin: 4.4 g/dL (ref 3.8–4.8)
Alkaline Phosphatase: 61 [IU]/L (ref 44–121)
BUN/Creatinine Ratio: 14 (ref 10–24)
BUN: 12 mg/dL (ref 8–27)
Bilirubin Total: 0.9 mg/dL (ref 0.0–1.2)
CO2: 24 mmol/L (ref 20–29)
Calcium: 10 mg/dL (ref 8.6–10.2)
Chloride: 92 mmol/L — ABNORMAL LOW (ref 96–106)
Creatinine, Ser: 0.84 mg/dL (ref 0.76–1.27)
Globulin, Total: 1.9 g/dL (ref 1.5–4.5)
Glucose: 170 mg/dL — ABNORMAL HIGH (ref 70–99)
Potassium: 5.1 mmol/L (ref 3.5–5.2)
Sodium: 131 mmol/L — ABNORMAL LOW (ref 134–144)
Total Protein: 6.3 g/dL (ref 6.0–8.5)
eGFR: 91 mL/min/{1.73_m2} (ref >59)

## 2022-12-03 LAB — LIPID PANEL
Chol/HDL Ratio: 2.6 ratio (ref 0.0–5.0)
Cholesterol, Total: 128 mg/dL (ref 100–199)
HDL: 50 mg/dL (ref >39)
LDL Chol Calc (NIH): 62 mg/dL (ref 0–99)
Triglycerides: 85 mg/dL (ref 0–149)
VLDL Cholesterol Cal: 16 mg/dL (ref 5–40)

## 2022-12-03 LAB — HEMOGLOBIN A1C
Est. average glucose Bld gHb Est-mCnc: 192 mg/dL
Hgb A1c MFr Bld: 8.3 % — ABNORMAL HIGH (ref 4.8–5.6)

## 2022-12-09 ENCOUNTER — Ambulatory Visit (INDEPENDENT_AMBULATORY_CARE_PROVIDER_SITE_OTHER): Payer: Medicare Other | Admitting: Family Medicine

## 2022-12-09 ENCOUNTER — Encounter: Payer: Self-pay | Admitting: Family Medicine

## 2022-12-09 VITALS — BP 127/80 | HR 86 | Ht 71.0 in | Wt 216.1 lb

## 2022-12-09 DIAGNOSIS — I129 Hypertensive chronic kidney disease with stage 1 through stage 4 chronic kidney disease, or unspecified chronic kidney disease: Secondary | ICD-10-CM

## 2022-12-09 DIAGNOSIS — E785 Hyperlipidemia, unspecified: Secondary | ICD-10-CM | POA: Diagnosis not present

## 2022-12-09 DIAGNOSIS — E119 Type 2 diabetes mellitus without complications: Secondary | ICD-10-CM

## 2022-12-09 DIAGNOSIS — Z794 Long term (current) use of insulin: Secondary | ICD-10-CM

## 2022-12-09 DIAGNOSIS — E1122 Type 2 diabetes mellitus with diabetic chronic kidney disease: Secondary | ICD-10-CM

## 2022-12-09 DIAGNOSIS — E871 Hypo-osmolality and hyponatremia: Secondary | ICD-10-CM | POA: Diagnosis not present

## 2022-12-09 DIAGNOSIS — E1169 Type 2 diabetes mellitus with other specified complication: Secondary | ICD-10-CM

## 2022-12-09 MED ORDER — METFORMIN HCL 500 MG PO TABS: ORAL_TABLET | ORAL | 3 refills | Status: DC

## 2022-12-09 MED ORDER — HYDROCHLOROTHIAZIDE 25 MG PO TABS: 25.0000 mg | ORAL_TABLET | Freq: Every day | ORAL | 3 refills | Status: DC

## 2022-12-09 MED ORDER — SIMVASTATIN 40 MG PO TABS: 40.0000 mg | ORAL_TABLET | Freq: Every day | ORAL | 3 refills | Status: DC

## 2022-12-09 NOTE — Patient Instructions (Signed)
It was nice to see you today,  We addressed the following topics today: -I am rechecking your sodium level and osmolality to see if we can determine the cause of why your sodium level was low.  Based on results of this I may order additional testing. - No changes to your medications today.   Have a great day,  Frederic Jericho, MD

## 2022-12-09 NOTE — Assessment & Plan Note (Signed)
Most likely secondary to his medication use.  Labs from 2020 and 2021 shows similar low sodium level.  Will get osmolality.  Unlikely to be due to alcohol.  Has not had alcohol in many many years.

## 2022-12-09 NOTE — Assessment & Plan Note (Signed)
LDL at goal.  Tolerating medication well.  Refilled today.  Continue to check yearly.

## 2022-12-09 NOTE — Assessment & Plan Note (Signed)
Most recent A1c was elevated again.  Patient has his diabetes managed through the [facility].  Is currently taking 10 units twice daily of his 70/30 insulin and the metformin and Ozempic.  Foot exam was normal today except for bilateral hallux valgus.  refilled  metformin,

## 2022-12-09 NOTE — Assessment & Plan Note (Signed)
Blood pressure at goal.  Continue current medications.  Sent in refill for HCTZ.

## 2022-12-09 NOTE — Progress Notes (Signed)
Established Patient Office Visit  Subjective   Patient ID: Lucas Silva, male    DOB: June 18, 1945  Age: 77 y.o. MRN: [id number]  Chief Complaint  Patient presents with   Medical Management of Chronic Issues    HPI  DM2-patient gets his diabetes managed at [facility].  He takes 10 units twice daily of 70/30 insulin, Ozempic 2 mg weekly on Fridays, metformin.  Patient sees the ophthalmologist at [facility] in [place].  Hyponatremia-we discussed patient's sodium level and repeating it with osmolality. Pt is fine with this.  Has not had any alcohol in several decades.   Hld - no concerns.  Tolerating medication.  Discussed labs.     The ASCVD Risk score (Arnett DK, et al., 2019) failed to calculate for the following reasons:   The valid total cholesterol range is 130 to 320 mg/dL  Health Maintenance Due  Topic Date Due   Hepatitis C Screening  Never done   Colonoscopy  Never done   OPHTHALMOLOGY EXAM  09/14/2021   Diabetic kidney evaluation - Urine ACR  06/02/2022   COVID-19 Vaccine (5 - 2023-24 season) 09/15/2022      Objective:     BP 127/80   Pulse 86   Ht 5\' 11"  (1.803 m)   Wt 216 lb 1.9 oz (98 kg)   SpO2 95%   BMI 30.14 kg/m    Physical Exam Constitutional:      Appearance: Normal appearance.  HENT:     Head: Normocephalic and atraumatic.  Cardiovascular:     Rate and Rhythm: Normal rate and regular rhythm.     Heart sounds: Normal heart sounds. No murmur heard.    No friction rub. No gallop.  Pulmonary:     Effort: Pulmonary effort is normal.     Breath sounds: Normal breath sounds.  Skin:    General: Skin is warm and dry.  Neurological:     Mental Status: He is alert.      No results found for any visits on 12/09/22.      Assessment & Plan:   Hyponatremia Assessment & Plan: Most likely secondary to his medication use.  Labs from 2020 and 2021 shows similar low sodium level.  Will get osmolality.  Unlikely to be due to alcohol.  Has not  had alcohol in many many years.  Orders: -     Basic metabolic panel -     Osmolality  Type 2 diabetes mellitus without complication, with long-term current use of insulin (HCC) Assessment & Plan: Most recent A1c was elevated again.  Patient has his diabetes managed through the [place].  Is currently taking 10 units twice daily of his 70/30 insulin and the metformin and Ozempic.  Foot exam was normal today except for bilateral hallux valgus.  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  Continue current medications.  Sent in refill for HCTZ.  Orders: -     hydroCHLOROthiazide; Take 1 tablet (25 mg total) by mouth daily.  Dispense: 90 tablet; Refill: 3  Hyperlipidemia due to type 2 diabetes mellitus (HCC) Assessment & Plan: LDL at goal.  Tolerating medication well.  Refilled today.  Continue to check yearly.  Orders: -  Simvastatin; Take 1 tablet (40 mg total) by mouth daily.  Dispense: 90 tablet; Refill: 3     Return in about 6 months (around 06/08/2023) for HTN, hld, DM.    Sandre Kitty, MD

## 2022-12-10 LAB — BASIC METABOLIC PANEL
BUN/Creatinine Ratio: 11 (ref 10–24)
BUN: 9 mg/dL (ref 8–27)
CO2: 24 mmol/L (ref 20–29)
Calcium: 9.6 mg/dL (ref 8.6–10.2)
Chloride: 91 mmol/L — ABNORMAL LOW (ref 96–106)
Creatinine, Ser: 0.82 mg/dL (ref 0.76–1.27)
Glucose: 151 mg/dL — ABNORMAL HIGH (ref 70–99)
Potassium: 4.9 mmol/L (ref 3.5–5.2)
Sodium: 131 mmol/L — ABNORMAL LOW (ref 134–144)
eGFR: 92 mL/min/{1.73_m2} (ref >59)

## 2022-12-10 LAB — OSMOLALITY: Osmolality Meas: 264 mosm/kg — ABNORMAL LOW (ref 280–301)

## 2022-12-11 LAB — MICROALBUMIN / CREATININE URINE RATIO
Creatinine, Urine: 77.1 mg/dL
Microalb/Creat Ratio: 5 mg/g{creat} (ref 0–29)
Microalbumin, Urine: 4 ug/mL

## 2022-12-21 ENCOUNTER — Ambulatory Visit
Admission: EM | Admit: 2022-12-21 | Discharge: 2022-12-21 | Disposition: A | Discharge status: Home / Self Care | Payer: Medicare Other | Attending: Physician Assistant | Admitting: Physician Assistant

## 2022-12-21 DIAGNOSIS — J069 Acute upper respiratory infection, unspecified: Secondary | ICD-10-CM | POA: Diagnosis not present

## 2022-12-21 LAB — POC COVID19/FLU A&B COMBO
Covid Antigen, POC: NEGATIVE
Influenza A Antigen, POC: NEGATIVE
Influenza B Antigen, POC: NEGATIVE

## 2022-12-21 NOTE — ED Provider Notes (Signed)
[facility]    CSN: [id number] Arrival date & time: 12/21/22  0831      History   Chief Complaint No chief complaint on file.   HPI Lucas Silva is a 77 y.o. male.   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 Authorizing Provider  acetaminophen (TYLENOL) 325 MG tablet Take 2 tablets (650 mg total) by mouth every 6 (six) hours as needed for mild pain or moderate pain. 10/07/15   Everlene Farrier, PA-C  aspirin 81 MG tablet Take 81 mg by mouth daily.    [provider]  cholecalciferol (VITAMIN D) 1000 units tablet Take 1,000 Units by mouth daily.    [provider]  doxycycline (VIBRAMYCIN) 100 MG capsule Take 1 capsule (100 mg total) by mouth 2 (two) times daily. Patient not taking: Reported on 09/10/2022 06/03/20   Raspet, Noberto Retort, PA-C  hydrochlorothiazide (HYDRODIURIL) 25 MG tablet Take 1 tablet (25 mg total) by mouth daily. 12/09/22   Sandre Kitty, MD  insulin aspart protamine- aspart (NOVOLOG MIX 70/30) (70-30) 100 UNIT/ML injection Inject 40 Units into the skin daily. Patient taking 14 units in AM and 25 units in the evening    [provider]  lisinopril  (ZESTRIL) 40 MG tablet Take 1 tablet (40 mg total) by mouth daily. 03/12/22   Carlean Jews, NP  metFORMIN (GLUCOPHAGE) 500 MG tablet Take 2 tablets po BID 12/09/22   Sandre Kitty, MD  methocarbamol (ROBAXIN) 500 MG tablet Take 1 tablet (500 mg total) by mouth 2 (two) times daily as needed for muscle spasms. 10/07/15   Everlene Farrier, PA-C  Semaglutide, 2 MG/DOSE, 8 MG/3ML SOPN Inject 2 mg as directed once a week. 03/12/22   Carlean Jews, NP  simvastatin (ZOCOR) 40 MG tablet Take 1 tablet (40 mg total) by mouth daily. 12/09/22   Sandre Kitty, MD  valACYclovir (VALTREX) 1000 MG tablet Take 1 tablet (1,000 mg total) by mouth 2 (two) times daily. 03/12/22   Carlean Jews, NP    Family History Family History  Problem Relation Age of Onset   Diabetes Mother    Stroke Father     Social History Social History   Tobacco Use   Smoking status: Never    Passive exposure: Never   Smokeless tobacco: Never  Vaping Use   Vaping status: Never Used  Substance Use Topics   Alcohol use: No    Alcohol/week: 0.0 standard drinks of alcohol   Drug use: No     Allergies   Patient has no known allergies.   Review of Systems Review of Systems  Constitutional:  Positive for chills. Negative for fever.  HENT:  Positive for congestion and sore throat. Negative for ear pain.   Eyes:  Negative for discharge and redness.  Respiratory:  Positive for cough. Negative for shortness of breath.   Gastrointestinal:  Negative for abdominal pain, nausea and vomiting.     Physical Exam Triage Vital Signs ED Triage Vitals  Encounter Vitals Group     BP      Systolic BP Percentile      Diastolic BP Percentile      Pulse      Resp      Temp      Temp src      SpO2      Weight      Height      Head Circumference      Peak Flow      Pain Score      Pain Loc      Pain Education      Exclude from Growth Chart    No data found.  Updated Vital Signs There were no vitals taken for  this visit.  Visual Acuity Right Eye Distance:   Left Eye Distance:   Bilateral Distance:    Right Eye Near:   Left Eye Near:    Bilateral Near:     Physical Exam Vitals and nursing note reviewed.  Constitutional:      General: He is not in acute distress.    Appearance: He is well-developed. He is not ill-appearing.  HENT:     Head: Normocephalic and atraumatic.     Right Ear: Tympanic membrane normal.     Left Ear: Tympanic membrane normal.     Nose: Congestion present.     Mouth/Throat:     Mouth: Mucous membranes are moist.     Pharynx: Posterior oropharyngeal erythema present. No oropharyngeal exudate.     Tonsils: 0 on the right. 0 on the left.  Eyes:     Conjunctiva/sclera: Conjunctivae normal.  Cardiovascular:     Rate and Rhythm: Normal rate and regular rhythm.     Heart sounds: Normal heart sounds. No murmur heard. Pulmonary:     Effort: Pulmonary effort is normal. No respiratory distress.     Breath sounds: Normal breath sounds. No wheezing, rhonchi or rales.  Skin:    General: Skin is warm and dry.  Neurological:     Mental Status: He is alert.  Psychiatric:        Mood and Affect: Mood normal.        Behavior: Behavior normal.      UC Treatments / Results  Labs (all labs ordered are listed, but only abnormal results are displayed) Labs Reviewed - No data to display  EKG   Radiology No results found.  Procedures Procedures (including critical care time)  Medications Ordered in UC Medications - No data to display  Initial Impression / Assessment and Plan / UC Course  I have reviewed the triage vital signs and the nursing notes.  Pertinent labs & imaging results that were available during my care of the patient were reviewed by me and considered in my medical decision making (see chart for details).     *** Final Clinical Impressions(s) / UC Diagnoses   Final diagnoses:  None   Discharge Instructions   None    ED Prescriptions    None    PDMP not reviewed this encounter.

## 2022-12-21 NOTE — ED Triage Notes (Signed)
"  This started with a Cough, sneezing and sinus congest/pressure about 3 days ago". "Not so bad but persisting". "When I lay down Cough is real bad". No fever. No sob. No wheezing known. "I had my Flu vaccine in Sept. 2024".

## 2022-12-25 ENCOUNTER — Encounter: Payer: Self-pay | Admitting: Physician Assistant

## 2023-01-20 LAB — COMPREHENSIVE METABOLIC PANEL WITH GFR: eGFR: 93

## 2023-01-20 LAB — BASIC METABOLIC PANEL WITH GFR: Creatinine: 0.8 (ref 0.6–1.3)

## 2023-01-20 LAB — HEMOGLOBIN A1C: Hemoglobin A1C: 8.3

## 2023-05-03 ENCOUNTER — Other Ambulatory Visit: Payer: Self-pay | Admitting: Nurse Practitioner

## 2023-05-03 DIAGNOSIS — E1122 Type 2 diabetes mellitus with diabetic chronic kidney disease: Secondary | ICD-10-CM

## 2023-05-05 ENCOUNTER — Other Ambulatory Visit: Payer: Self-pay | Admitting: Family Medicine

## 2023-05-05 DIAGNOSIS — E1122 Type 2 diabetes mellitus with diabetic chronic kidney disease: Secondary | ICD-10-CM

## 2023-05-05 MED ORDER — LISINOPRIL 40 MG PO TABS
40.0000 mg | ORAL_TABLET | Freq: Every day | ORAL | 1 refills | Status: DC
Start: 2023-05-05 — End: 2023-10-22

## 2023-06-12 ENCOUNTER — Ambulatory Visit: Payer: Medicare Other | Admitting: Family Medicine

## 2023-06-30 LAB — HEMOGLOBIN A1C: Hemoglobin A1C: 8.2

## 2023-08-06 ENCOUNTER — Ambulatory Visit: Admitting: Family Medicine

## 2023-08-21 ENCOUNTER — Encounter: Payer: Self-pay | Admitting: Family Medicine

## 2023-08-21 ENCOUNTER — Ambulatory Visit (INDEPENDENT_AMBULATORY_CARE_PROVIDER_SITE_OTHER): Admitting: Family Medicine

## 2023-08-21 VITALS — BP 135/82 | HR 81 | Ht 71.0 in | Wt 214.4 lb

## 2023-08-21 DIAGNOSIS — E785 Hyperlipidemia, unspecified: Secondary | ICD-10-CM

## 2023-08-21 DIAGNOSIS — Z0000 Encounter for general adult medical examination without abnormal findings: Secondary | ICD-10-CM

## 2023-08-21 DIAGNOSIS — E1169 Type 2 diabetes mellitus with other specified complication: Secondary | ICD-10-CM | POA: Diagnosis not present

## 2023-08-21 DIAGNOSIS — Z794 Long term (current) use of insulin: Secondary | ICD-10-CM | POA: Diagnosis not present

## 2023-08-21 DIAGNOSIS — E119 Type 2 diabetes mellitus without complications: Secondary | ICD-10-CM

## 2023-08-21 LAB — POCT GLYCOSYLATED HEMOGLOBIN (HGB A1C): HbA1c POC (<> result, manual entry): 7.3 % (ref 4.0–5.6)

## 2023-08-21 NOTE — Patient Instructions (Signed)
 It was nice to see you today,  We addressed the following topics today: -I will send in a request to the [facility] for your records and updated labs. - Your A1c was 7.3   Have a great day,  Rolan Slain, MD

## 2023-08-21 NOTE — Progress Notes (Unsigned)
   Annual physical  Subjective   Patient ID: Lucas Silva, male    DOB: 1945/04/29  Age: 78 y.o. MRN: [id number]  Chief Complaint  Patient presents with   Annual Exam   HPI Lucas Silva is a 78 y.o. old male here  for annual exam.   Work:*** Relationship:*** Children:*** Tobacco:*** Alcohol:*** Recreational drugs:***  Diet:*** Exercise:***  Family history of prostate or colorectal cancer:***  Advance directive:***  Other providers:***  Dr. Joshua feet - dr. Kateri.  Eye doctor.  [facility]   HPI  Separate, acute concerns today: ***  The ASCVD Risk score (Arnett DK, et al., 2019) failed to calculate for the following reasons:   The valid total cholesterol range is 130 to 320 mg/dL  Health Maintenance Due  Topic Date Due   Hepatitis C Screening  Never done   OPHTHALMOLOGY EXAM  09/14/2021   COVID-19 Vaccine (5 - 2024-25 season) 09/15/2022   HEMOGLOBIN A1C  06/01/2023   INFLUENZA VACCINE  08/15/2023   Medicare Annual Wellness (AWV)  09/10/2023      Objective:     BP 135/82   Pulse 81   Ht 5' 11 (1.803 m)   Wt 214 lb 6.4 oz (97.3 kg)   SpO2 99%   BMI 29.90 kg/m  {Vitals History (Optional):23777}  Physical Exam   No results found for any visits on 08/21/23.      Assessment & Plan:   There are no diagnoses linked to this encounter.   Return in about 1 year (around 08/20/2024) for physical.    Lucas MARLA Slain, MD

## 2023-08-22 NOTE — Assessment & Plan Note (Signed)
 [facility]  A1c 7.3.  insulin reduced to 5 Units, per patient report.  Unable to see records from [facility].  Will attempt records request. Declined UACR and foot exam today, stating he has had both recently with the [facility].

## 2023-09-18 ENCOUNTER — Ambulatory Visit: Payer: Medicare Other

## 2023-09-18 DIAGNOSIS — Z0000 Encounter for general adult medical examination without abnormal findings: Secondary | ICD-10-CM | POA: Diagnosis not present

## 2023-09-18 NOTE — Addendum Note (Signed)
 Addended by: Shaquitta Burbridge E on: 09/18/2023 09:14 AM   Modules accepted: Level of Service

## 2023-09-18 NOTE — Patient Instructions (Signed)
 Mr. Ottaway , Thank you for taking time out of your busy schedule to complete your Annual Wellness Visit with me. I enjoyed our conversation and look forward to speaking with you again next year. I, as well as your care team,  appreciate your ongoing commitment to your health goals. Please review the following plan we discussed and let me know if I can assist you in the future. Your Game plan/ To Do List    Referrals: If you haven't heard from the office you've been referred to, please reach out to them at the phone provided.   Follow up Visits: We will see or speak with you next year for your Next Medicare AWV with our clinical staff Have you seen your provider in the last 6 months (3 months if uncontrolled diabetes)? Yes  Clinician Recommendations:  Aim for 30 minutes of exercise or brisk walking, 6-8 glasses of water, and 5 servings of fruits and vegetables each day.       This is a list of the screenings recommended for you:  Health Maintenance  Topic Date Due   Hepatitis C Screening  Never done   Eye exam for diabetics  09/14/2021   Flu Shot  08/15/2023   COVID-19 Vaccine (5 - 2025-26 season) 09/15/2023   Yearly kidney function blood test for diabetes  12/09/2023   Yearly kidney health urinalysis for diabetes  12/09/2023   Complete foot exam   12/09/2023   Hemoglobin A1C  02/21/2024   Medicare Annual Wellness Visit  09/17/2024   DTaP/Tdap/Td vaccine (4 - Td or Tdap) 06/02/2031   Pneumococcal Vaccine for age over 6  Completed   Zoster (Shingles) Vaccine  Completed   HPV Vaccine  Aged Out   Meningitis B Vaccine  Aged Out    Advanced directives: (Copy Requested) Please bring a copy of your health care power of attorney and living will to the office to be added to your chart at your convenience. You can mail to [facility] [address] [place], [place] [address] or email to [email or web address] Advance Care Planning is important because it:  [x]  Makes  sure you receive the medical care that is consistent with your values, goals, and preferences  [x]  It provides guidance to your family and loved ones and reduces their decisional burden about whether or not they are making the right decisions based on your wishes.  Follow the link provided in your after visit summary or read over the paperwork we have mailed to you to help you started getting your Advance Directives in place. If you need assistance in completing these, please reach out to us  so that we can help you!  See attachments for Preventive Care and Fall Prevention Tips.

## 2023-09-18 NOTE — Progress Notes (Signed)
 Subjective:   Lucas Silva is a 78 y.o. who presents for a Medicare Wellness preventive visit.  As a reminder, Annual Wellness Visits don't include a physical exam, and some assessments may be limited, especially if this visit is performed virtually. We may recommend an in-person follow-up visit with your provider if needed.  Visit Complete: Virtual I connected with  Lucas Silva on 09/18/23 by a audio enabled telemedicine application and verified that I am speaking with the correct person using two identifiers.  Patient Location: Home  Provider Location: Home Office  I discussed the limitations of evaluation and management by telemedicine. The patient expressed understanding and agreed to proceed.  Vital Signs: Because this visit was a virtual/telehealth visit, some criteria may be missing or patient reported. Any vitals not documented were not able to be obtained and vitals that have been documented are patient reported.  VideoError- Librarian, academic were attempted between this provider and patient, however failed, due to patient having technical difficulties OR patient did not have access to video capability.  We continued and completed visit with audio only.   Persons Participating in Visit: Patient.  AWV Questionnaire: No: Patient Medicare AWV questionnaire was not completed prior to this visit.  Cardiac Risk Factors include: advanced age (>28men, >75 women);diabetes mellitus;dyslipidemia;hypertension;male gender     Objective:    Today's Vitals   There is no height or weight on file to calculate BMI.     09/18/2023    8:31 AM 09/10/2022    8:49 AM 10/07/2015    5:01 PM  Advanced Directives  Does Patient Have a Medical Advance Directive? Yes Yes No   Type of Estate agent of [place];Living will Healthcare Power of [place];Living will   Copy of Healthcare Power of Attorney in Chart? No - copy requested No - copy  requested   Would patient like information on creating a medical advance directive?   No - patient declined information      Data saved with a previous flowsheet row definition    Current Medications (verified) Outpatient Encounter Medications as of 09/18/2023  Medication Sig   acetaminophen  (TYLENOL ) 325 MG tablet Take 2 tablets (650 mg total) by mouth every 6 (six) hours as needed for mild pain or moderate pain.   aspirin 81 MG tablet Take 81 mg by mouth daily.   cholecalciferol (VITAMIN D) 1000 units tablet Take 1,000 Units by mouth daily.   hydrochlorothiazide  (HYDRODIURIL ) 25 MG tablet Take 1 tablet (25 mg total) by mouth daily.   insulin aspart protamine- aspart (NOVOLOG MIX 70/30) (70-30) 100 UNIT/ML injection Inject 40 Units into the skin daily. Patient taking 14 units in AM and 25 units in the evening   lisinopril  (ZESTRIL ) 40 MG tablet Take 1 tablet (40 mg total) by mouth daily.   metFORMIN  (GLUCOPHAGE ) 500 MG tablet Take 2 tablets po BID   methocarbamol  (ROBAXIN ) 500 MG tablet Take 1 tablet (500 mg total) by mouth 2 (two) times daily as needed for muscle spasms.   Semaglutide , 2 MG/DOSE, 8 MG/3ML SOPN Inject 2 mg as directed once a week.   simvastatin  (ZOCOR ) 40 MG tablet Take 1 tablet (40 mg total) by mouth daily.   valACYclovir  (VALTREX ) 1000 MG tablet Take 1 tablet (1,000 mg total) by mouth 2 (two) times daily.   doxycycline  (VIBRAMYCIN ) 100 MG capsule Take 1 capsule (100 mg total) by mouth 2 (two) times daily. (Patient not taking: Reported on 09/18/2023)   No  facility-administered encounter medications on file as of 09/18/2023.    Allergies (verified) Patient has no known allergies.   History: Past Medical History:  Diagnosis Date   Diabetes mellitus without complication (HCC)    Hyperlipidemia    Hypertension    Past Surgical History:  Procedure Laterality Date   CATARACT EXTRACTION Right    05/2023   HERNIA REPAIR     Family History  Problem Relation Age of Onset    Diabetes Mother    Stroke Father    Social History   Socioeconomic History   Marital status: Married    Spouse name: Not on file   Number of children: Not on file   Years of education: Not on file   Highest education level: Not on file  Occupational History   Not on file  Tobacco Use   Smoking status: Never    Passive exposure: Never   Smokeless tobacco: Never  Vaping Use   Vaping status: Never Used  Substance and Sexual Activity   Alcohol use: No    Alcohol/week: 0.0 standard drinks of alcohol   Drug use: No   Sexual activity: Not Currently  Other Topics Concern   Not on file  Social History Narrative   Not on file   Social Drivers of Health   Financial Resource Strain: Low Risk  (09/18/2023)   Overall Financial Resource Strain (CARDIA)    Difficulty of Paying Living Expenses: Not hard at all  Food Insecurity: No Food Insecurity (09/18/2023)   Hunger Vital Sign    Worried About Running Out of Food in the Last Year: Never true    Ran Out of Food in the Last Year: Never true  Transportation Needs: No Transportation Needs (09/18/2023)   PRAPARE - Administrator, Civil Service (Medical): No    Lack of Transportation (Non-Medical): No  Physical Activity: Insufficiently Active (09/18/2023)   Exercise Vital Sign    Days of Exercise per Week: 7 days    Minutes of Exercise per Session: 20 min  Stress: No Stress Concern Present (09/18/2023)   [employer] of Occupational Health - Occupational Stress Questionnaire    Feeling of Stress: Not at all  Social Connections: Moderately Isolated (09/18/2023)   Social Connection and Isolation Panel    Frequency of Communication with Friends and Family: More than three times a week    Frequency of Social Gatherings with Friends and Family: Twice a week    Attends Religious Services: Never    Database administrator or Organizations: No    Attends Engineer, structural: Never    Marital Status: Married    Tobacco  Counseling Counseling given: Not Answered    Clinical Intake:  Pre-visit preparation completed: Yes  Pain : No/denies pain     Nutritional Risks: None Diabetes: Yes CBG done?: No Did pt. bring in CBG monitor from home?: No  Lab Results  Component Value Date   HGBA1C 7.3 08/21/2023   HGBA1C 8.3 (H) 12/02/2022   HGBA1C 7.9 (A) 09/13/2021     How often do you need to have someone help you when you read instructions, pamphlets, or other written materials from your doctor or pharmacy?: 1 - Never  Interpreter Needed?: No  Information entered by :: NAllen LPN   Activities of Daily Living     09/18/2023    8:26 AM  In your present state of health, do you have any difficulty performing the following activities:  Hearing? 0  Vision? 0  Difficulty concentrating or making decisions? 0  Walking or climbing stairs? 0  Dressing or bathing? 0  Doing errands, shopping? 0  Preparing Food and eating ? N  Using the Toilet? N  In the past six months, have you accidently leaked urine? N  Do you have problems with loss of bowel control? N  Managing your Medications? N  Managing your Finances? N  Housekeeping or managing your Housekeeping? N    Patient Care Team: Chandra Toribio POUR, MD as PCP - General (Family Medicine)  I have updated your Care Teams any recent Medical Services you may have received from other providers in the past year.     Assessment:   This is a routine wellness examination for Springer.  Hearing/Vision screen Hearing Screening - Comments:: Denies hearing issues Vision Screening - Comments:: [facility]   Goals Addressed             This Visit's Progress    Patient Stated       09/18/2023, winning the lottery       Depression Screen     09/18/2023    8:33 AM 08/21/2023   11:19 AM 12/09/2022    9:24 AM 03/12/2022    9:20 AM 09/13/2021    9:59 AM 03/13/2021   10:35 AM 09/12/2020   10:19 AM  PHQ 2/9 Scores  PHQ - 2 Score 0 0 0 0 0 0 0  PHQ-  9 Score 0 0 0  0 1 0    Fall Risk     09/18/2023    8:32 AM 09/10/2022    8:48 AM 03/12/2022    9:20 AM 09/12/2020   10:18 AM 03/10/2020   10:32 AM  Fall Risk   Falls in the past year? 0 1 0 0 0  Number falls in past yr: 0 0 0 0   Injury with Fall? 0 0 0 0   Risk for fall due to : Medication side effect No Fall Risks  No Fall Risks   Follow up Falls evaluation completed;Falls prevention discussed Falls prevention discussed  Falls evaluation completed       Data saved with a previous flowsheet row definition    MEDICARE RISK AT HOME:  Medicare Risk at Home Any stairs in or around the home?: Yes If so, are there any without handrails?: Yes Home free of loose throw rugs in walkways, pet beds, electrical cords, etc?: Yes Adequate lighting in your home to reduce risk of falls?: Yes Life alert?: No Use of a cane, walker or w/c?: No Grab bars in the bathroom?: No Shower chair or bench in shower?: No Elevated toilet seat or a handicapped toilet?: No  TIMED UP AND GO:  Was the test performed?  No  Cognitive Function: 6CIT completed        09/18/2023    8:33 AM 09/10/2022    8:49 AM 09/13/2021   10:00 AM 09/12/2020    9:46 AM  6CIT Screen  What Year? 0 points 0 points 0 points 0 points  What month? 0 points 0 points 0 points 0 points  What time? 0 points 0 points 0 points 0 points  Count back from 20 0 points 0 points 0 points 0 points  Months in reverse 0 points 0 points 0 points 0 points  Repeat phrase 2 points 0 points 0 points 0 points  Total Score 2 points 0 points 0 points 0 points    Immunizations Immunization  History  Administered Date(s) Administered    sv, Bivalent, Protein Subunit Rsvpref,pf (Abrysvo) 06/06/2022   Fluad Quad(high Dose 65+) 10/10/2016, 10/21/2017, 09/23/2019, 11/09/2020   Fluad Trivalent(High Dose 65+) 10/10/2016   H1N1 01/26/2008   INFLUENZA, HIGH DOSE SEASONAL PF 09/21/2012, 10/05/2013, 11/22/2014, 10/09/2015, 10/14/2017, 09/22/2018, 11/20/2021,  10/14/2022   Influenza Split 10/15/2014, 10/15/2015, 10/07/2016   Influenza, Seasonal, Injecte, Preservative Fre 09/14/2013   Influenza,inj,Quad PF,6+ Mos 09/14/2013   Influenza,trivalent, recombinat, inj, PF 12/27/2011, 10/15/2014, 10/15/2015, 10/07/2016   Influenza-Unspecified 10/14/2000, 11/08/2003, 03/06/2005, 11/11/2006, 01/26/2008, 03/28/2009, 09/14/2009, 10/25/2009, 09/26/2010, 09/15/2018, 09/26/2019   Moderna Covid-19 Vaccine Bivalent Booster 74yrs & up 11/30/2020   Moderna Sars-Covid-2 Vaccination 03/03/2019, 03/31/2019, 12/04/2019   Pneumococcal Conjugate-13 08/23/2014, 10/05/2015   Pneumococcal Polysaccharide-23 12/27/2011, 10/06/2012, 12/27/2016   Pneumococcal-Unspecified 04/15/2000   Tdap 01/23/2011, 12/27/2011, 06/01/2021   Varicella 12/27/2011   Zoster Recombinant(Shingrix) 06/14/2016, 10/21/2017, 01/05/2018    Screening Tests Health Maintenance  Topic Date Due   Hepatitis C Screening  Never done   OPHTHALMOLOGY EXAM  09/14/2021   INFLUENZA VACCINE  08/15/2023   COVID-19 Vaccine (5 - 2025-26 season) 09/15/2023   Diabetic kidney evaluation - eGFR measurement  12/09/2023   Diabetic kidney evaluation - Urine ACR  12/09/2023   FOOT EXAM  12/09/2023   HEMOGLOBIN A1C  02/21/2024   Medicare Annual Wellness (AWV)  09/17/2024   DTaP/Tdap/Td (4 - Td or Tdap) 06/02/2031   Pneumococcal Vaccine: 50+ Years  Completed   Zoster Vaccines- Shingrix  Completed   HPV VACCINES  Aged Out   Meningococcal B Vaccine  Aged Out    Health Maintenance  Health Maintenance Due  Topic Date Due   Hepatitis C Screening  Never done   OPHTHALMOLOGY EXAM  09/14/2021   INFLUENZA VACCINE  08/15/2023   COVID-19 Vaccine (5 - 2025-26 season) 09/15/2023   Health Maintenance Items Addressed: States will get flu and covid vaccine at the [facility].   Additional Screening:  Vision Screening: Recommended annual ophthalmology exams for early detection of glaucoma and other disorders of the eye. Would you  like a referral to an eye doctor? No    Dental Screening: Recommended annual dental exams for proper oral hygiene  Community Resource Referral / Chronic Care Management: CRR required this visit?  No   CCM required this visit?  No   Plan:    I have personally reviewed and noted the following in the patient's chart:   Medical and social history Use of alcohol, tobacco or illicit drugs  Current medications and supplements including opioid prescriptions. Patient is not currently taking opioid prescriptions. Functional ability and status Nutritional status Physical activity Advanced directives List of other physicians Hospitalizations, surgeries, and ER visits in previous 12 months Vitals Screenings to include cognitive, depression, and falls Referrals and appointments  In addition, I have reviewed and discussed with patient certain preventive protocols, quality metrics, and best practice recommendations. A written personalized care plan for preventive services as well as general preventive health recommendations were provided to patient.   Ardella FORBES Dawn, LPN   0/05/[id number]   After Visit Summary: (Pick Up) Due to this being a telephonic visit, with patients personalized plan was offered to patient and patient has requested to Pick up at office.  Notes: Nothing significant to report at this time.

## 2023-10-08 NOTE — Progress Notes (Unsigned)
 AMOND SPERANZA                                          MRN: [id number]   10/08/2023   The [facility] Quality Team Specialist reviewed this patient medical record for the purposes of chart review for care gap closure. The following were reviewed: {CHL AMB [facility]I QUALITY SPECIALIST REASON FOR REVIEW:[id number]}.    [facility] Quality Team

## 2023-10-21 ENCOUNTER — Encounter: Payer: Self-pay | Admitting: Family Medicine

## 2023-10-22 ENCOUNTER — Other Ambulatory Visit: Payer: Self-pay | Admitting: Family Medicine

## 2023-10-22 DIAGNOSIS — E1122 Type 2 diabetes mellitus with diabetic chronic kidney disease: Secondary | ICD-10-CM

## 2023-10-29 NOTE — Progress Notes (Signed)
 Lucas Silva                                          MRN: [id number]   10/29/2023   The [facility] Quality Team Specialist reviewed this patient medical record for the purposes of chart review for care gap closure. The following were reviewed: chart review for care gap closure-kidney health evaluation for diabetes:eGFR  and uACR.    [facility] Quality Team

## 2023-12-01 ENCOUNTER — Encounter: Payer: Self-pay | Admitting: Family Medicine

## 2023-12-05 ENCOUNTER — Encounter: Payer: Self-pay | Admitting: *Deleted

## 2023-12-05 NOTE — Progress Notes (Signed)
 Lucas Silva                                          MRN: [id number]   12/05/2023   The [facility] Quality Team Specialist reviewed this patient medical record for the purposes of chart review for care gap closure. The following were reviewed: chart review for care gap closure-kidney health evaluation for diabetes:eGFR  and uACR.    [facility] Quality Team

## 2023-12-19 ENCOUNTER — Encounter: Payer: Self-pay | Admitting: *Deleted

## 2023-12-19 NOTE — Progress Notes (Signed)
 ABDULLOH Silva                                          MRN: [id number]   12/19/2023   The [facility] Quality Team Specialist reviewed this patient medical record for the purposes of chart review for care gap closure. The following were reviewed: chart review for care gap closure-kidney health evaluation for diabetes:eGFR  and uACR.    [facility] Quality Team

## 2023-12-31 NOTE — Progress Notes (Signed)
 Lucas Silva                                          MRN: [id number]   12/31/2023   The [facility] Quality Team Specialist reviewed this patient medical record for the purposes of chart review for care gap closure. The following were reviewed: chart review for care gap closure-kidney health evaluation for diabetes:eGFR  and uACR.    [facility] Quality Team

## 2024-01-22 ENCOUNTER — Other Ambulatory Visit: Payer: Self-pay | Admitting: Family Medicine

## 2024-01-22 DIAGNOSIS — E1122 Type 2 diabetes mellitus with diabetic chronic kidney disease: Secondary | ICD-10-CM

## 2024-01-23 ENCOUNTER — Other Ambulatory Visit: Payer: Self-pay

## 2024-01-23 ENCOUNTER — Other Ambulatory Visit: Payer: Self-pay | Admitting: Family Medicine

## 2024-01-23 DIAGNOSIS — E1122 Type 2 diabetes mellitus with diabetic chronic kidney disease: Secondary | ICD-10-CM

## 2024-01-23 DIAGNOSIS — E1169 Type 2 diabetes mellitus with other specified complication: Secondary | ICD-10-CM

## 2024-01-23 DIAGNOSIS — E119 Type 2 diabetes mellitus without complications: Secondary | ICD-10-CM

## 2024-08-16 ENCOUNTER — Other Ambulatory Visit

## 2024-08-23 ENCOUNTER — Encounter: Admitting: Family Medicine
